# Patient Record
Sex: Male | Born: 1980 | Race: White | Hispanic: No | Marital: Married | Smoking: Never smoker
Health system: Southern US, Community
[De-identification: ages and names within clinical notes are randomized; demographics above are authoritative.]

## PROBLEM LIST (undated history)

## (undated) DIAGNOSIS — F909 Attention-deficit hyperactivity disorder, unspecified type: Secondary | ICD-10-CM

## (undated) DIAGNOSIS — F419 Anxiety disorder, unspecified: Secondary | ICD-10-CM

## (undated) DIAGNOSIS — F32A Depression, unspecified: Secondary | ICD-10-CM

## (undated) DIAGNOSIS — F329 Major depressive disorder, single episode, unspecified: Secondary | ICD-10-CM

## (undated) HISTORY — DX: Anxiety disorder, unspecified: F41.9

---

## 2013-06-01 DIAGNOSIS — F988 Other specified behavioral and emotional disorders with onset usually occurring in childhood and adolescence: Secondary | ICD-10-CM | POA: Insufficient documentation

## 2013-06-03 ENCOUNTER — Encounter (HOSPITAL_BASED_OUTPATIENT_CLINIC_OR_DEPARTMENT_OTHER): Payer: Self-pay | Admitting: Emergency Medicine

## 2013-06-03 ENCOUNTER — Emergency Department (HOSPITAL_BASED_OUTPATIENT_CLINIC_OR_DEPARTMENT_OTHER)
Admission: EM | Admit: 2013-06-03 | Discharge: 2013-06-03 | Disposition: A | Discharge status: Home / Self Care | Payer: Self-pay | Attending: Emergency Medicine | Admitting: Emergency Medicine

## 2013-06-03 DIAGNOSIS — Y92410 Unspecified street and highway as the place of occurrence of the external cause: Secondary | ICD-10-CM | POA: Insufficient documentation

## 2013-06-03 DIAGNOSIS — S060X0A Concussion without loss of consciousness, initial encounter: Secondary | ICD-10-CM | POA: Insufficient documentation

## 2013-06-03 DIAGNOSIS — F3289 Other specified depressive episodes: Secondary | ICD-10-CM | POA: Insufficient documentation

## 2013-06-03 DIAGNOSIS — F329 Major depressive disorder, single episode, unspecified: Secondary | ICD-10-CM | POA: Insufficient documentation

## 2013-06-03 DIAGNOSIS — Z79899 Other long term (current) drug therapy: Secondary | ICD-10-CM | POA: Insufficient documentation

## 2013-06-03 DIAGNOSIS — S0990XA Unspecified injury of head, initial encounter: Secondary | ICD-10-CM

## 2013-06-03 DIAGNOSIS — Y9389 Activity, other specified: Secondary | ICD-10-CM | POA: Insufficient documentation

## 2013-06-03 DIAGNOSIS — R110 Nausea: Secondary | ICD-10-CM | POA: Insufficient documentation

## 2013-06-03 HISTORY — DX: Major depressive disorder, single episode, unspecified: F32.9

## 2013-06-03 HISTORY — DX: Depression, unspecified: F32.A
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
## 2013-06-03 NOTE — ED Notes (Signed)
D/c home. Ambulatory with steady gait

## 2016-01-17 ENCOUNTER — Ambulatory Visit (INDEPENDENT_AMBULATORY_CARE_PROVIDER_SITE_OTHER): Payer: Worker's Compensation | Admitting: Family Medicine

## 2016-01-17 VITALS — BP 118/70 | HR 75 | Temp 98.0°F

## 2016-01-17 DIAGNOSIS — S0191XA Laceration without foreign body of unspecified part of head, initial encounter: Secondary | ICD-10-CM | POA: Diagnosis not present

## 2016-01-17 DIAGNOSIS — Z23 Encounter for immunization: Secondary | ICD-10-CM

## 2016-01-17 MED ORDER — CEPHALEXIN 500 MG PO CAPS: 500.0000 mg | ORAL_CAPSULE | Freq: Two times a day (BID) | ORAL | 0 refills | Status: AC

## 2016-01-17 NOTE — Patient Instructions (Addendum)
HEAD INJURY If any of the following occur notify your physician or go to the Hospital Emergency Department: . Increased drowsiness, stupor or loss of consciousness . Restlessness or convulsions (fits) . Paralysis in arms or legs . Temperature above 100 F . Vomiting . Severe headache . Blood or clear fluid dripping from the nose or ears . Stiffness of the neck . Dizziness or blurred vision . Pulsating pain in the eye . Unequal pupils of eye . Personality changes . Any other unusual symptoms PRECAUTIONS . Keep head elevated at all times for the first 24 hours (Elevate mattress if pillow is ineffective) . Do not take tranquilizers, sedatives, narcotics or alcohol . Avoid aspirin. Use only acetaminophen (e.g. Tylenol) or ibuprofen (e.g. Advil) for relief of pain. Follow directions on the bottle for dosage. . Use ice packs for comfort Have parent, spouse, or friend awaken patient every 4 hour(s) and assess. MEDICATIONS Use medications only as directed by your physician  WOUND CARE Please return in 7 days to have your stitches/staples removed or sooner if you have concerns. Marland Kitchen Keep area clean and dry for 24 hours. Do not remove bandage, if applied. . After 24 hours, remove bandage and wash wound gently with mild soap and warm water. Reapply a new bandage after cleaning wound, if directed. . Continue daily cleansing with soap and water until stitches/staples are removed. . Do not apply any ointments or creams to the wound while stitches/staples are in place, as this may cause delayed healing. . Notify the office if you experience any of the following signs of infection: Swelling, redness, pus drainage, streaking, fever >101.0 F . Notify the office if you experience excessive bleeding that does not stop after 15-20 minutes of constant, firm pressure.    IF you received an x-ray today, you will receive an invoice from [facility]. Please contact [facility]  at [phone] with questions or concerns regarding your invoice.   IF you received labwork today, you will receive an invoice from [employer]. Please contact [facility] at [phone] with questions or concerns regarding your invoice.   Our billing staff will not be able to assist you with questions regarding bills from these companies.  You will be contacted with the lab results as soon as they are available. The fastest way to get your results is to activate your My Chart account. Instructions are located on the last page of this paperwork. If you have not heard from [place] regarding the results in 2 weeks, please contact this office.

## 2016-01-17 NOTE — Progress Notes (Signed)
Greg Jackson [birth year] 35 y.o.   Chief Complaint  Patient presents with  . Head Injury    hit head at work around 20 mins ago.    Date of Injury: 01/17/16  History of Present Illness:  Presents for evaluation of work-related complaint. [employer]. 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   Once the teeth slipped a bar feel down hit him the left side head.  He did not lose consciousness.  He was in a squatting position when impact occurred. After injury, patient noticed a large amount of blood draining from his left side of head/scalp. Applied pressure and cleaned area. Supervisor was present during incident. Patient hasn't taken any medication. He was transported to [facility] via office today by a co-worker. Denies headache but describes mild pressure at the site impact. Denies blurry vision.Denies dizziness. Reports no bloody or clear discharge or drainage from ear or nose.  Review of Systems  Constitutional: Negative for fever and malaise/fatigue.  HENT: Negative for ear discharge, ear pain, nosebleeds and tinnitus.   Respiratory: Negative.   Cardiovascular: Negative.   Neurological: Negative for dizziness, tingling, tremors, speech change, focal weakness, seizures, weakness and headaches.       See HPI   Current medications and allergies reviewed and updated. Past medical history, family history, social history have been reviewed and updated.   Physical Exam  Constitutional: He is oriented to person, place, and time and well-developed, well-nourished, and in no distress.  Cardiovascular: Normal rate, regular rhythm and normal heart sounds.   Pulmonary/Chest: Effort normal and breath sounds normal.  Neurological: He is alert and oriented to person, place, and time. Gait normal. GCS score is 15.     Verbal informed consent obtained Local anesthesia used 1% lidocaine without epinephrine  6.5 cc used  to achieve  effect. without epinephrine used  Cleaned with soap and water Irrigated with normal saline. Prepped with betadine  No glia or body deformity noted No foreign object or  debris in wound bed Closed with #3 simple interrupted 4.0 prolene sutures, cleansed and dressed.  Assessment and Plan:  1. Laceration of head, initial encounter Patient presents to the office with an open laceration to right upper scalp.  He was completely alert, oriented, and exhibited no signs of neurological deficits. Tolerated laceration repair.   Cephalexin 500 mg, twice daily for 5 days to prevent infection. Return in 7 days for suture removal. Handout given regarding red flags following head-injury Wound care instructions provided. Follow-up as needed.  2. Encounter for immunization TDAP given prophylactically to prevent infection due to injury involving an open wound.   Godfrey Pick. Tiburcio Pea, MSN, FNP-C [facility]

## 2016-01-24 ENCOUNTER — Ambulatory Visit (INDEPENDENT_AMBULATORY_CARE_PROVIDER_SITE_OTHER): Payer: Worker's Compensation | Admitting: Physician Assistant

## 2016-01-24 VITALS — BP 110/66 | HR 73 | Temp 98.3°F | Resp 16 | Ht 67.0 in | Wt 190.2 lb

## 2016-01-24 DIAGNOSIS — S0191XD Laceration without foreign body of unspecified part of head, subsequent encounter: Secondary | ICD-10-CM

## 2016-01-24 NOTE — Progress Notes (Signed)
   Greg Jackson  MRN: [id number] DOB: [birth year]  Subjective:  Pt presents to clinic for suture removal.  He has had no problems with the area.  Review of Systems  Constitutional: Negative for chills and fever.    There are no active problems to display for this patient.   Current Outpatient Prescriptions on File Prior to Visit  Medication Sig Dispense Refill  . cephALEXin (KEFLEX) 500 MG capsule Take 1 capsule (500 mg total) by mouth 2 (two) times daily. 14 capsule 0  . escitalopram (LEXAPRO) 20 MG tablet Take 20 mg by mouth daily.     No current facility-administered medications on file prior to visit.     No Known Allergies  Pt patients past, family and social history were reviewed and updated.  Objective:  BP 110/66 (BP Location: Left Arm, Patient Position: Sitting, Cuff Size: Normal)   Pulse 73   Temp 98.3 F (36.8 C) (Oral)   Resp 16   Ht 5\' 7"  (1.702 m)   Wt 190 lb 3.2 oz (86.3 kg)   SpO2 96%   BMI 29.79 kg/m   Physical Exam  Constitutional: He is oriented to person, place, and time and well-developed, well-nourished, and in no distress.  HENT:  Head: Normocephalic and atraumatic.  Right Ear: External ear normal.  Left Ear: External ear normal.  Eyes: Conjunctivae are normal.  Neck: Normal range of motion.  Pulmonary/Chest: Effort normal.  Neurological: He is alert and oriented to person, place, and time. Gait normal.  Skin: Skin is warm and dry.  Well healed wound on the left upper forehead - scab in place - sutures removed with out problems  Psychiatric: Mood, memory, affect and judgment normal.    Assessment and Plan :  Laceration of head, subsequent encounter wound care d/w pt patient.  Benny Lennert PA-C  [facility] 01/24/2016 3:09 PM

## 2016-01-24 NOTE — Patient Instructions (Signed)
     IF you received an x-ray today, you will receive an invoice from [facility]. Please contact [facility] at [phone] with questions or concerns regarding your invoice.   IF you received labwork today, you will receive an invoice from [facility]. Please contact [facility] at [phone] with questions or concerns regarding your invoice.   Our billing staff will not be able to assist you with questions regarding bills from these companies.  You will be contacted with the lab results as soon as they are available. The fastest way to get your results is to activate your My Chart account. Instructions are located on the last page of this paperwork. If you have not heard from us regarding the results in 2 weeks, please contact this office.      

## 2017-05-27 DIAGNOSIS — F3342 Major depressive disorder, recurrent, in full remission: Secondary | ICD-10-CM | POA: Insufficient documentation

## 2018-01-08 ENCOUNTER — Encounter (HOSPITAL_BASED_OUTPATIENT_CLINIC_OR_DEPARTMENT_OTHER): Payer: Self-pay | Admitting: Emergency Medicine

## 2018-01-08 ENCOUNTER — Other Ambulatory Visit: Payer: Self-pay

## 2018-01-08 ENCOUNTER — Emergency Department (HOSPITAL_BASED_OUTPATIENT_CLINIC_OR_DEPARTMENT_OTHER)
Admission: EM | Admit: 2018-01-08 | Discharge: 2018-01-08 | Disposition: A | Discharge status: Home / Self Care | Payer: Self-pay | Attending: Emergency Medicine | Admitting: Emergency Medicine

## 2018-01-08 DIAGNOSIS — Z79899 Other long term (current) drug therapy: Secondary | ICD-10-CM | POA: Insufficient documentation

## 2018-01-08 DIAGNOSIS — K047 Periapical abscess without sinus: Secondary | ICD-10-CM | POA: Insufficient documentation

## 2018-01-08 HISTORY — DX: Attention-deficit hyperactivity disorder, unspecified type: F90.9

## 2018-01-08 MED ORDER — PENICILLIN V POTASSIUM 250 MG PO TABS: 250.0000 mg | ORAL_TABLET | Freq: Four times a day (QID) | ORAL | 0 refills | Status: AC

## 2018-01-08 NOTE — ED Notes (Signed)
ED Provider at bedside. 

## 2018-01-08 NOTE — ED Provider Notes (Signed)
[facility] EMERGENCY DEPARTMENT Provider Note   CSN: [id number] Arrival date & time: 01/08/18  1046     History   Chief Complaint Chief Complaint  Patient presents with  . Dental Pain    HPI Greg Jackson is a 37 y.o. male.  Patient is a 37 year old male with a history of anxiety and depression who is presenting today with left upper dental pain and facial swelling.  He states his tooth broke off about 6 months ago but symptoms did not really start until 2 days ago.  He denies any difficulty swallowing or fever.  He tried to call the dentist office today but nowhere is open.  The history is provided by the patient.  Dental Pain   This is a new problem. The current episode started 2 days ago. The problem occurs constantly. The problem has been gradually worsening. The pain is at a severity of 3/10. The pain is mild. He has tried nothing for the symptoms. The treatment provided no relief.    Past Medical History:  Diagnosis Date  . ADHD   . Anxiety   . Depression     There are no active problems to display for this patient.   History reviewed. No pertinent surgical history.      Home Medications    Prior to Admission medications   Medication Sig Start Date End Date Taking? Authorizing Provider  escitalopram (LEXAPRO) 20 MG tablet Take 20 mg by mouth daily.    [provider]  penicillin v potassium (VEETID) 250 MG tablet Take 1 tablet (250 mg total) by mouth 4 (four) times daily for 7 days. 01/08/18 01/15/18  Gwyneth SproutPlunkett, Haidan Nhan, MD    Family History No family history on file.  Social History Social History   Tobacco Use  . Smoking status: Never Smoker  . Smokeless tobacco: Never Used  Substance Use Topics  . Alcohol use: Yes  . Drug use: Never     Allergies   Patient has no known allergies.   Review of Systems Review of Systems  All other systems reviewed and are negative.    Physical Exam Updated Vital Signs BP 120/83    Pulse 90   Temp 98 F (36.7 C) (Oral)   Resp 18   Ht 5\' 7"  (1.702 m)   Wt 81.6 kg (180 lb)   SpO2 100%   BMI 28.19 kg/m   Physical Exam  Constitutional: He is oriented to person, place, and time. He appears well-developed and well-nourished. No distress.  HENT:  Head: Normocephalic and atraumatic.  Mouth/Throat: Oropharynx is clear and moist. Dental caries present.    Eyes: Pupils are equal, round, and reactive to light. EOM are normal.  Cardiovascular: Normal rate.  Pulmonary/Chest: Effort normal.  Lymphadenopathy:    He has no cervical adenopathy.  Neurological: He is alert and oriented to person, place, and time.  Skin: Skin is warm and dry.  Psychiatric: He has a normal mood and affect. His behavior is normal.  Nursing note and vitals reviewed.    ED Treatments / Results  Labs (all labs ordered are listed, but only abnormal results are displayed) Labs Reviewed - No data to display  EKG None  Radiology No results found.  Procedures Procedures (including critical care time)  Medications Ordered in ED Medications - No data to display   Initial Impression / Assessment and Plan / ED Course  I have reviewed the triage vital signs and the nursing notes.  Pertinent labs &  imaging results that were available during my care of the patient were reviewed by me and considered in my medical decision making (see chart for details).     Pt with dental caries and facial swelling.  No signs of ludwig's angina or difficulty swallowing and no systemic symptoms. Will treat with PCN and have pt f/u with dentist.  Final Clinical Impressions(s) / ED Diagnoses   Final diagnoses:  Dental abscess    ED Discharge Orders        Ordered    penicillin v potassium (VEETID) 250 MG tablet  4 times daily     01/08/18 1104       Gwyneth Sprout, MD 01/08/18 1108

## 2018-01-08 NOTE — ED Triage Notes (Signed)
L upper dental pain x 2 days due to a broken tooth.

## 2019-01-26 ENCOUNTER — Encounter (HOSPITAL_COMMUNITY): Payer: Self-pay

## 2019-01-26 ENCOUNTER — Other Ambulatory Visit: Payer: Self-pay

## 2019-01-26 ENCOUNTER — Emergency Department (HOSPITAL_COMMUNITY): Payer: Self-pay

## 2019-01-26 ENCOUNTER — Emergency Department (HOSPITAL_COMMUNITY)
Admission: EM | Admit: 2019-01-26 | Discharge: 2019-01-27 | Disposition: A | Discharge status: Home / Self Care | Payer: Self-pay | Attending: Emergency Medicine | Admitting: Emergency Medicine

## 2019-01-26 DIAGNOSIS — R7989 Other specified abnormal findings of blood chemistry: Secondary | ICD-10-CM | POA: Insufficient documentation

## 2019-01-26 DIAGNOSIS — R072 Precordial pain: Secondary | ICD-10-CM | POA: Insufficient documentation

## 2019-01-26 DIAGNOSIS — F419 Anxiety disorder, unspecified: Secondary | ICD-10-CM | POA: Insufficient documentation

## 2019-01-26 DIAGNOSIS — R739 Hyperglycemia, unspecified: Secondary | ICD-10-CM | POA: Insufficient documentation

## 2019-01-26 DIAGNOSIS — F909 Attention-deficit hyperactivity disorder, unspecified type: Secondary | ICD-10-CM | POA: Insufficient documentation

## 2019-01-26 DIAGNOSIS — K029 Dental caries, unspecified: Secondary | ICD-10-CM | POA: Insufficient documentation

## 2019-01-26 DIAGNOSIS — Z79899 Other long term (current) drug therapy: Secondary | ICD-10-CM | POA: Insufficient documentation

## 2019-01-26 LAB — CBC WITH DIFFERENTIAL/PLATELET
Abs Immature Granulocytes: 0.03 10*3/uL (ref 0.00–0.07)
Basophils Absolute: 0.1 10*3/uL (ref 0.0–0.1)
Basophils Relative: 1 %
Eosinophils Absolute: 0.1 10*3/uL (ref 0.0–0.5)
Eosinophils Relative: 1 %
HCT: 38.7 % — ABNORMAL LOW (ref 39.0–52.0)
Hemoglobin: 14 g/dL (ref 13.0–17.0)
Immature Granulocytes: 0 %
Lymphocytes Relative: 28 %
Lymphs Abs: 2.6 10*3/uL (ref 0.7–4.0)
MCH: 31.1 pg (ref 26.0–34.0)
MCHC: 36.2 g/dL — ABNORMAL HIGH (ref 30.0–36.0)
MCV: 86 fL (ref 80.0–100.0)
Monocytes Absolute: 0.7 10*3/uL (ref 0.1–1.0)
Monocytes Relative: 7 %
Neutro Abs: 6.1 10*3/uL (ref 1.7–7.7)
Neutrophils Relative %: 63 %
Platelets: 211 10*3/uL (ref 150–400)
RBC: 4.5 MIL/uL (ref 4.22–5.81)
RDW: 11.8 % (ref 11.5–15.5)
WBC: 9.6 10*3/uL (ref 4.0–10.5)
nRBC: 0 % (ref 0.0–0.2)

## 2019-01-26 LAB — BASIC METABOLIC PANEL
Anion gap: 12 (ref 5–15)
BUN: 13 mg/dL (ref 6–20)
CO2: 23 mmol/L (ref 22–32)
Calcium: 9.4 mg/dL (ref 8.9–10.3)
Chloride: 101 mmol/L (ref 98–111)
Creatinine, Ser: 1.36 mg/dL — ABNORMAL HIGH (ref 0.61–1.24)
GFR calc Af Amer: >60 mL/min (ref >60)
GFR calc non Af Amer: >60 mL/min (ref >60)
Glucose, Bld: 144 mg/dL — ABNORMAL HIGH (ref 70–99)
Potassium: 3.4 mmol/L — ABNORMAL LOW (ref 3.5–5.1)
Sodium: 136 mmol/L (ref 135–145)

## 2019-01-26 LAB — TROPONIN I (HIGH SENSITIVITY): Troponin I (High Sensitivity): 4 ng/L (ref <18)

## 2019-01-26 NOTE — ED Provider Notes (Signed)
[facility] Provider Note   CSN: [id number] Arrival date & time: 01/26/19  1530  History   Chief Complaint Chief Complaint  Patient presents with  . Panic Attack  . Multiple Complaints   HPI Greg Jackson is a 38 y.o. male with past medical history significant for anxiety, ADHD, depression who presents for evaluation multiple complaints.  Patient states he is been having difficulty with his anxiety.  Recently started Wellbutrin approximately 1 week ago.  Has been having increased panic attacks approximately 1-2 times a week.  Patient states that he has had increased stressors and approximately 13 hours PTA he developed substernal chest pain as well as palpitations and became diaphoretic.  Patient states "I thought I was going to die."  Patient states this did feel similar to his previous episodes of panic attacks.  States he is having difficulty focusing at work due to inattention.  States he did have a tick bite approximately 2 months ago to his right hip that had a bull's-eye at that time however resolved after 1 week.  He denies fever, chills, nausea, vomiting, headache, neck pain, neck stiffness, drooling, dysphasia, trismus, abdominal pain, diarrhea, dysuria, decreased range of motion, numbness or tingling to his extremities.  Denies redness, swelling, warmth to his extremities.  Denies additional aggravating relieving factors.  Denies SI, HI, AVH.  Denies prior hospitalizations for his anxiety or depression.  History obtained from patient and past medical records.  No interpreter is used.     HPI  Past Medical History:  Diagnosis Date  . ADHD   . Anxiety   . Depression     There are no active problems to display for this patient.   History reviewed. No pertinent surgical history.      Home Medications    Prior to Admission medications   Medication Sig Start Date End Date Taking? Authorizing Provider  amphetamine-dextroamphetamine  (ADDERALL XR) 10 MG 24 hr capsule Take 10 mg by mouth daily.    [provider]  escitalopram (LEXAPRO) 20 MG tablet Take 20 mg by mouth daily.    [provider]  hydrOXYzine (ATARAX/VISTARIL) 25 MG tablet Take 1 tablet (25 mg total) by mouth every 6 (six) hours. 01/27/19   Phillippe Orlick A, PA-C    Family History History reviewed. No pertinent family history.  Social History Social History   Tobacco Use  . Smoking status: Never Smoker  . Smokeless tobacco: Never Used  Substance Use Topics  . Alcohol use: Yes  . Drug use: Never     Allergies   Patient has no known allergies.   Review of Systems Review of Systems  Constitutional: Negative.   HENT: Negative.   Respiratory: Negative.   Cardiovascular: Positive for chest pain. Negative for palpitations and leg swelling.  Gastrointestinal: Negative.   Genitourinary: Negative.   Musculoskeletal: Negative.   Skin: Negative.   Neurological: Negative.  Negative for dizziness, facial asymmetry and headaches.  Psychiatric/Behavioral: Positive for decreased concentration. Negative for self-injury, sleep disturbance and suicidal ideas. The patient is nervous/anxious.   All other systems reviewed and are negative.    Physical Exam Updated Vital Signs BP 130/90   Pulse 69   Temp 98.3 F (36.8 C) (Oral)   Resp 16   Ht 5\' 8"  (1.727 m)   Wt 84.4 kg   SpO2 97%   BMI 28.28 kg/m   Physical Exam Vitals signs and nursing note reviewed.  Constitutional:      General:  He is not in acute distress.    Appearance: He is not ill-appearing, toxic-appearing or diaphoretic.  HENT:     Head: Normocephalic and atraumatic.     Jaw: There is normal jaw occlusion.     Comments: No submandibular swelling.  Sublingual area soft.  No Ludwig's angina.    Nose: Nose normal.     Mouth/Throat:     Lips: Pink.     Mouth: Mucous membranes are moist.     Dentition: Dental caries present. No dental tenderness, gingival swelling,  dental abscesses or gum lesions.     Pharynx: Oropharynx is clear. Uvula midline.     Tonsils: No tonsillar exudate or tonsillar abscesses.     Comments: Posterior oropharynx clear. Mucous membranes moist. No obvious facial swelling.  No drooling, dysphasia or trismus.  No evidence of PTA or RPA.  Overall poor dentition with multiple dental caries.  No evidence of periapical abscess.  No facial swelling.  No gingival erythema, redness or tenderness. Eyes:     Extraocular Movements: Extraocular movements intact.  Neck:     Musculoskeletal: Full passive range of motion without pain, normal range of motion and neck supple.     Vascular: No carotid bruit or JVD.     Trachea: Trachea and phonation normal.     Meningeal: Brudzinski's sign and Kernig's sign absent.  Cardiovascular:     Rate and Rhythm: Normal rate.     Pulses: Normal pulses.          Radial pulses are 2+ on the right side and 2+ on the left side.       Posterior tibial pulses are 2+ on the right side and 2+ on the left side.     Heart sounds: Normal heart sounds.     Comments: No murmur, rubs or gallops. Pulmonary:     Effort: Pulmonary effort is normal.     Breath sounds: Normal breath sounds and air entry.     Comments: Clear to auscultation bilateral without wheeze, rhonchi rales.  No accessory muscle usage.  Speaks in full sentences without difficulty. Chest:     Chest wall: No deformity, swelling, tenderness, crepitus or edema.  Abdominal:     Comments: Soft, nontender without rebound or guarding.  Normoactive bowel sounds.  Musculoskeletal:     Right lower leg: No edema.     Left lower leg: No edema.     Comments: Moves all 4 extremities without difficulty.  Feet:     Right foot:     Skin integrity: Skin integrity normal.     Left foot:     Skin integrity: Skin integrity normal.  Skin:    General: Skin is warm.     Capillary Refill: Capillary refill takes less than 2 seconds.     Comments: No edema, erythema,  ecchymosis or warmth.  Brisk capillary refill.  Neurological:     General: No focal deficit present.     Mental Status: He is alert and oriented to person, place, and time.     Comments: Cranial nerves II through XII grossly intact.  No facial droop.  Ambulatory that difficulty.    ED Treatments / Results  Labs (all labs ordered are listed, but only abnormal results are displayed) Labs Reviewed  CBC WITH DIFFERENTIAL/PLATELET - Abnormal; Notable for the following components:      Result Value   HCT 38.7 (*)    MCHC 36.2 (*)    All other components within normal limits  BASIC METABOLIC PANEL - Abnormal; Notable for the following components:   Potassium 3.4 (*)    Glucose, Bld 144 (*)    Creatinine, Ser 1.36 (*)    All other components within normal limits  TROPONIN I (HIGH SENSITIVITY)    EKG EKG Interpretation  Date/Time:  Thursday January 26 2019 15:49:11 EDT Ventricular Rate:  84 PR Interval:  150 QRS Duration: 80 QT Interval:  360 QTC Calculation: 425 R Axis:   -14 Text Interpretation:  Normal sinus rhythm Normal ECG Confirmed by Benjiman CorePickering, Nathan [phone]([id number]) on 01/26/2019 11:25:57 PM   Radiology Dg Chest 2 View  Result Date: 01/26/2019 CLINICAL DATA:  Chest pain EXAM: CHEST - 2 VIEW COMPARISON:  None. FINDINGS: The heart size and mediastinal contours are within normal limits. Both lungs are clear. The visualized skeletal structures are unremarkable. IMPRESSION: No active cardiopulmonary disease. Electronically Signed   By: Jasmine PangKim  Fujinaga M.D.   On: 01/26/2019 23:13    Procedures Procedures (including critical care time)  Medications Ordered in ED Medications - No data to display  Initial Impression / Assessment and Plan / ED Course  I have reviewed the triage vital signs and the nursing notes.  Pertinent labs & imaging results that were available during my care of the patient were reviewed by me and considered in my medical decision making (see chart for details).   38 year old male appears otherwise well presents for evaluation multiple complaints.  He is afebrile, nonseptic, non-ill-appearing.  Patient with substernal chest pain which occurred approximately 12 hours prior to my initial evaluation.  Substernal with palpitations and diaphoresis.  Does have history of panic attacks and states this feels similar.  Denies exertional chest pain or pleuritic chest pain.  PERC, Wells criteria low risk. HEART score 0. No history of hypertension, diabetes, family history of MI early age.  No evidence of DVT on exam. Denies SI, HI, AVH.  No evidence of DVT on exam.  Heart and lungs clear.  Recently started on Wellbutrin for his anxiety from his PCP. No prior history of psych hospitalizations.  EKG sinus rhythm without ST/T changes.  No STEMI. DG chest without infiltrates, cardiomegaly, pulmonary edema, pneumothorax, widened mediastinum. Troponin negative, given symptoms greater than 13 hours PTA do not feel patient needs delta troponin at this time.  I have low suspicion for ACS, PE, dissection, myocarditis, pericarditis. CBC without leukocytosis BMP mild hypokalemia 3.0, creatinine 1.36, hyperglycemia at 144     Patient is to be discharged with recommendation to follow up with PCP in regards to today's hospital visit. Chest pain is not likely of cardiac or pulmonary etiology d/t presentation, PERC negative, VSS, no tracheal deviation, no JVD or new murmur, RRR, breath sounds equal bilaterally, EKG without acute abnormalities, negative troponin, and negative CXR. Pt has been advised to return to the ED if CP becomes exertional, associated with diaphoresis or nausea, radiates to left jaw/arm, worsens or becomes concerning in any way. Pt appears reliable for follow up and is agreeable to discharge.   The patient has been appropriately medically screened and/or stabilized in the ED. I have low suspicion for any other emergent medical condition which would require further  screening, evaluation or treatment in the ED or require inpatient management.  Patient is hemodynamically stable and in no acute distress.  Patient able to ambulate in department prior to ED.  Evaluation does not show acute pathology that would require ongoing or additional emergent interventions while in the emergency department or further inpatient treatment.  I have discussed the diagnosis with the patient and answered all questions.  Pain is been managed while in the emergency department and patient has no further complaints prior to discharge.  Patient is comfortable with plan discussed in room and is stable for discharge at this time.  I have discussed strict return precautions for returning to the emergency department.  Patient was encouraged to follow-up with PCP/specialist refer to at discharge.  Final Clinical Impressions(s) / ED Diagnoses   Final diagnoses:  Anxiety  Precordial pain  Elevated serum creatinine  Blood glucose elevated    ED Discharge Orders         Ordered    hydrOXYzine (ATARAX/VISTARIL) 25 MG tablet  Every 6 hours     01/27/19 0001           Theodoros Stjames A, PA-C 01/27/19 0006    Benjiman CorePickering, Nathan, MD 01/28/19 [phone]
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
## 2019-01-27 LAB — TROPONIN I (HIGH SENSITIVITY): Troponin I (High Sensitivity): 3 ng/L (ref <18)

## 2019-01-27 MED ORDER — HYDROXYZINE HCL 25 MG PO TABS: 25.0000 mg | ORAL_TABLET | Freq: Four times a day (QID) | ORAL | 0 refills | Status: DC

## 2019-01-27 NOTE — Discharge Instructions (Signed)
Follow-up with PCP for reevaluation.  Take medications as prescribed.

## 2019-08-23 ENCOUNTER — Other Ambulatory Visit: Payer: Self-pay

## 2019-08-23 ENCOUNTER — Ambulatory Visit (INDEPENDENT_AMBULATORY_CARE_PROVIDER_SITE_OTHER): Payer: Self-pay | Admitting: Adult Health Nurse Practitioner

## 2019-08-23 ENCOUNTER — Encounter: Payer: Self-pay | Admitting: Adult Health Nurse Practitioner

## 2019-08-23 VITALS — BP 131/82 | HR 74 | Temp 98.1°F | Ht 67.0 in | Wt 177.2 lb

## 2019-08-23 DIAGNOSIS — F988 Other specified behavioral and emotional disorders with onset usually occurring in childhood and adolescence: Secondary | ICD-10-CM

## 2019-08-23 DIAGNOSIS — F3342 Major depressive disorder, recurrent, in full remission: Secondary | ICD-10-CM

## 2019-08-23 MED ORDER — CLONAZEPAM 0.5 MG PO TABS: 0.5000 mg | ORAL_TABLET | Freq: Two times a day (BID) | ORAL | 0 refills | Status: DC | PRN

## 2019-08-23 MED ORDER — AMPHETAMINE-DEXTROAMPHETAMINE 20 MG PO TABS: 20.0000 mg | ORAL_TABLET | Freq: Two times a day (BID) | ORAL | 0 refills | Status: DC

## 2019-08-23 MED ORDER — ESCITALOPRAM OXALATE 20 MG PO TABS: 20.0000 mg | ORAL_TABLET | Freq: Every day | ORAL | 1 refills | Status: DC

## 2019-08-23 NOTE — Patient Instructions (Addendum)

## 2019-08-23 NOTE — Progress Notes (Signed)
  Chief Complaint  Patient presents with  . Establish Care    HPI   Patient presents to establish care.  Had been seen here many years ago.  Previously, diagnosed with ADHD and has been off his medications for awhile.  Also, was taking Lexapro for anxiety and depression.  Reports these medications have worked best for him in controlling anxiety and depression.    Denies any new depression or difficulties.  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   Problem List    Problem List: 2018-12: Recurrent major depression in full remission (HCC) 2014-12: Dyslexia 2014-12: ADD (attention deficit disorder)   Allergies   has No Known Allergies.  Medications    Current Outpatient Medications:  .  amphetamine-dextroamphetamine (ADDERALL) 30 MG tablet, Take by mouth., Disp: , Rfl:  .  escitalopram (LEXAPRO) 20 MG tablet, Take 20 mg by mouth daily., Disp: , Rfl:    Review of Systems    Constitutional: Negative for activity change, appetite change, chills and fever.  HENT: Negative for congestion, nosebleeds, trouble swallowing and voice change.   Respiratory: Negative for cough, shortness of breath and wheezing.   Cardiac:  Negative for chest pain, pressure, syncope  Gastrointestinal: Negative for diarrhea, nausea and vomiting.  Genitourinary: Negative for difficulty urinating, dysuria, flank pain and hematuria.  Musculoskeletal: Negative for back pain, joint swelling and neck pain.  Neurological: Negative for dizziness, speech difficulty, light-headedness and numbness.  Psychological ROS: positive for - anxiety and depression negative for - concentration difficulties or depression  See HPI. All other review of systems negative.     Physical Exam:    height is 5\' 7"  (1.702 m) and weight is 177 lb 3.2 oz (80.4 kg). His temporal temperature is 98.1 F (36.7 C). His blood pressure is 131/82 and his pulse is 74. His oxygen saturation is 96%.   Physical Examination:  General appearance - alert, well appearing, and in no distress and oriented to person, place, and time Mental status - normal mood, behavior, speech, dress, motor activity, and thought processes Eyes - PERRL. Extraocular movements intact.  No nystagmus.  Neck - supple, no significant adenopathy, carotids upstroke normal bilaterally, no bruits, thyroid exam: thyroid is normal in size without nodules or tenderness Chest - clear to auscultation, no wheezes, rales or rhonchi, symmetric air entry  Heart - normal rate, regular rhythm, normal S1, S2, no murmurs, rubs, clicks or gallops Extremities - dependent LE edema without clubbing or cyanosis Skin - normal coloration and turgor, no rashes, no suspicious skin lesions noted  No hyperpigmentation of skin.  No current hematomas noted   Lab /Imaging Review    no lab studies available for review at time of visit.   Assessment & Plan:  Greg Jackson is a 39 y.o. male    1. Attention deficit disorder, unspecified hyperactivity presence   2. Recurrent major depression in full remission (HCC)    Given his anxiety, will try prn script of Clonazepam for anxiety.  Advised to use sparingly.    20, NP

## 2019-09-20 ENCOUNTER — Encounter: Payer: Self-pay | Admitting: Adult Health Nurse Practitioner

## 2019-09-20 ENCOUNTER — Other Ambulatory Visit: Payer: Self-pay

## 2019-09-20 ENCOUNTER — Ambulatory Visit (INDEPENDENT_AMBULATORY_CARE_PROVIDER_SITE_OTHER): Payer: Self-pay | Admitting: Adult Health Nurse Practitioner

## 2019-09-20 VITALS — BP 120/83 | HR 77 | Temp 98.3°F | Ht 67.0 in | Wt 177.2 lb

## 2019-09-20 DIAGNOSIS — F909 Attention-deficit hyperactivity disorder, unspecified type: Secondary | ICD-10-CM

## 2019-09-20 MED ORDER — AMPHETAMINE-DEXTROAMPHET ER 20 MG PO CP24: 20.0000 mg | ORAL_CAPSULE | ORAL | 0 refills | Status: DC

## 2019-09-20 NOTE — Patient Instructions (Signed)
° ° ° °  If you have lab work done today you will be contacted with your lab results within the next 2 weeks.  If you have not heard from us then please contact us. The fastest way to get your results is to register for My Chart. ° ° °IF you received an x-ray today, you will receive an invoice from [facility]. Please contact [facility] at [phone] with questions or concerns regarding your invoice.  ° °IF you received labwork today, you will receive an invoice from [facility]. Please contact [facility] at [phone] with questions or concerns regarding your invoice.  ° °Our billing staff will not be able to assist you with questions regarding bills from these companies. ° °You will be contacted with the lab results as soon as they are available. The fastest way to get your results is to activate your My Chart account. Instructions are located on the last page of this paperwork. If you have not heard from us regarding the results in 2 weeks, please contact this office. °  ° ° ° °

## 2019-10-17 NOTE — Progress Notes (Signed)
Subjective:     Greg Jackson is a 39 y.o. male who presents for follow up of ADHD.  He is having some side effects from the IR Adderall with sweating, nervousness, and inconsistent effect from the IR.   He has been successful with the XR and would like to change to that dosage form.    The following portions of the patient's history were reviewed and updated as appropriate: allergies, current medications, past family history, past medical history, past social history, past surgical history and problem list.    Objective:    BP 120/83   Pulse 77   Temp 98.3 F (36.8 C) (Temporal)   Ht 5\' 7"  (1.702 m)   Wt 177 lb 3.2 oz (80.4 kg)   SpO2 98%   BMI 27.75 kg/m   General:  alert and cooperative  Affect/Behavior:  good insight, normal perception and normal speech pattern and content no negative findings      Assessment:   1. Attention deficit hyperactivity disorder (ADHD), unspecified ADHD type       Plan:   Meds ordered this encounter  Medications  . amphetamine-dextroamphetamine (ADDERALL XR) 20 MG 24 hr capsule    Sig: Take 1 capsule (20 mg total) by mouth every morning.    Dispense:  30 capsule    Refill:  0   No orders of the defined types were placed in this encounter.  Will f/u in one month to determine if the XR is a better fit.  He is inline with this plan.   , NP

## 2019-10-20 ENCOUNTER — Other Ambulatory Visit: Payer: Self-pay | Admitting: Adult Health Nurse Practitioner

## 2019-10-20 ENCOUNTER — Ambulatory Visit (INDEPENDENT_AMBULATORY_CARE_PROVIDER_SITE_OTHER): Payer: Self-pay | Admitting: Adult Health Nurse Practitioner

## 2019-10-20 ENCOUNTER — Other Ambulatory Visit: Payer: Self-pay

## 2019-10-20 VITALS — BP 140/91 | HR 92 | Temp 98.5°F | Ht 67.0 in | Wt 171.0 lb

## 2019-10-20 DIAGNOSIS — F902 Attention-deficit hyperactivity disorder, combined type: Secondary | ICD-10-CM

## 2019-10-20 DIAGNOSIS — R300 Dysuria: Secondary | ICD-10-CM

## 2019-10-20 DIAGNOSIS — F329 Major depressive disorder, single episode, unspecified: Secondary | ICD-10-CM

## 2019-10-20 DIAGNOSIS — F4329 Adjustment disorder with other symptoms: Secondary | ICD-10-CM

## 2019-10-20 LAB — POCT URINALYSIS DIP (MANUAL ENTRY)
Bilirubin, UA: NEGATIVE
Blood, UA: NEGATIVE
Glucose, UA: NEGATIVE mg/dL
Ketones, POC UA: NEGATIVE mg/dL
Leukocytes, UA: NEGATIVE
Nitrite, UA: NEGATIVE
Protein Ur, POC: NEGATIVE mg/dL
Spec Grav, UA: 1.01 (ref 1.010–1.025)
Urobilinogen, UA: 0.2 E.U./dL
pH, UA: 6 (ref 5.0–8.0)

## 2019-10-20 MED ORDER — AMPHETAMINE-DEXTROAMPHETAMINE 10 MG PO TABS: 45.0000 mg | ORAL_TABLET | Freq: Two times a day (BID) | ORAL | 0 refills | Status: DC

## 2019-10-20 MED ORDER — PAROXETINE HCL 30 MG PO TABS: 30.0000 mg | ORAL_TABLET | Freq: Every day | ORAL | 3 refills | Status: DC

## 2019-10-20 NOTE — Patient Instructions (Signed)
° ° ° °  If you have lab work done today you will be contacted with your lab results within the next 2 weeks.  If you have not heard from us then please contact us. The fastest way to get your results is to register for My Chart. ° ° °IF you received an x-ray today, you will receive an invoice from [facility]. Please contact  Radiology at [phone] with questions or concerns regarding your invoice.  ° °IF you received labwork today, you will receive an invoice from [facility]. Please contact [facility] at [phone] with questions or concerns regarding your invoice.  ° °Our billing staff will not be able to assist you with questions regarding bills from these companies. ° °You will be contacted with the lab results as soon as they are available. The fastest way to get your results is to activate your My Chart account. Instructions are located on the last page of this paperwork. If you have not heard from us regarding the results in 2 weeks, please contact this office. °  ° ° ° °

## 2019-10-23 ENCOUNTER — Telehealth: Payer: Self-pay | Admitting: General Practice

## 2019-10-23 ENCOUNTER — Other Ambulatory Visit: Payer: Self-pay | Admitting: Registered Nurse

## 2019-10-23 DIAGNOSIS — F902 Attention-deficit hyperactivity disorder, combined type: Secondary | ICD-10-CM

## 2019-10-23 MED ORDER — AMPHETAMINE-DEXTROAMPHETAMINE 10 MG PO TABS: 10.0000 mg | ORAL_TABLET | Freq: Two times a day (BID) | ORAL | 0 refills | Status: DC

## 2019-10-23 NOTE — Telephone Encounter (Signed)
The following medications exceeds the dosage allowed to refill . Please advise . Please reach out to pharmacy  amphetamine-dextroamphetamine (ADDERALL) 10 MG tablet [[id number]]  [facility] [id number], [place] - [address]  [address] [place] [address]  Phone: [phone] Fax: [phone]

## 2019-10-23 NOTE — Telephone Encounter (Signed)
Patient needs refill on Adderall  And pharmacy stated they could not give him the ADDERALL 10 mg tablets because of the instructions.  Please Advise

## 2019-10-25 ENCOUNTER — Other Ambulatory Visit: Payer: Self-pay | Admitting: Adult Health Nurse Practitioner

## 2019-10-25 MED ORDER — AMPHETAMINE-DEXTROAMPHET ER 20 MG PO CP24
20.0000 mg | ORAL_CAPSULE | ORAL | 0 refills | Status: DC
Start: 2019-10-25 — End: 2020-05-24

## 2019-10-26 ENCOUNTER — Encounter: Payer: Self-pay | Admitting: Adult Health Nurse Practitioner

## 2019-10-26 DIAGNOSIS — F4329 Adjustment disorder with other symptoms: Secondary | ICD-10-CM | POA: Insufficient documentation

## 2019-10-26 HISTORY — DX: Adjustment disorder with other symptoms: F43.29

## 2019-10-26 MED ORDER — PAROXETINE HCL 30 MG PO TABS: 30.0000 mg | ORAL_TABLET | Freq: Every day | ORAL | 3 refills | Status: DC

## 2019-10-26 MED ORDER — CLONAZEPAM 0.5 MG PO TABS: 0.5000 mg | ORAL_TABLET | Freq: Two times a day (BID) | ORAL | 0 refills | Status: DC | PRN

## 2019-10-26 NOTE — Progress Notes (Signed)
Chief Complaint  Patient presents with  . Follow-up    ADHD/Anxiety 39/depression 25    HPI   Greg Jackson is doing an acute follow-up for his increased anxiety, depression, and ADHD symptoms.  Approximately a week ago his wife, back who I also see stated that she wanted a divorce.  The patient admits to having a problem with repeated use of Internet porn and other websites which his wife feels is treating.  He is visibly distressed.  Feels that he cannot control his emotions currently.  Also feels he is failing at his job because he is unable to concentrate due to the pressing emotional situational issues.    He has a long hx of ADHD since childhood in which he struggled and was diagnosed as dyslexic.  He finds it hard to focus both at home and at work and enjoys his work.  He requests to see a therapist as soon as this weekend.  I let him know that the referral would likely be completed this Monday and at that time, could schedule.  If he needs to see someone on the weekend or is feeing mentally unstable, he has contracted to safety and will go to the ER.  We discussed coping mechanisms for his current anxiety and distress state.  Encouraged exercise when he is feeling emotionally labile.  He will consider but has not exercised or biked since his 72s.    He has two children, one 8 and one who is one year.  Reports increased stress surrounding childcare and parental responsibilities as well as trying to prevent imself from turning to porn.   He is quite tearful and distressed during this visit.  We discussed his medication.  He does not think the current antidepressant is effective for him currently.  Anxiety is very high and he is unable to quell racing thoughts.  Though he acknowledged the current situation is causing this, he would like to adjust medications accordingly during this time.  We discussed different SSRIs, SNRIs, as well as IR dosing of the Adderall to help him be able to complete his work  and keep his job.   He denies any suicidal or homicidal ideations.  He and his wife are going to counseling though they haven't gotten an appointment yet.   In addition he complains of dysuria.  He complains of the need to push his bladder in order to urinate like he can start his stream.  He is concerned he might have a urinary tract infection.  There is no pain, burning, or any discharge.  He has been taking anticholinergic such as Benadryl and others over the past week which could be contributing only prior to lab on eight December      Problem List    Problem List: 2021-04: Dysuria 2018-12: Recurrent major depression in full remission (HCC) 2014-12: Dyslexia 2014-12: ADD (attention deficit disorder)   Allergies   has No Known Allergies.  Medications    Current Outpatient Medications:  .  clonazePAM (KLONOPIN) 0.5 MG tablet, Take 1 tablet (0.5 mg total) by mouth 2 (two) times daily as needed for up to 30 doses for anxiety (1 tab q 12 hours prn for anxiety attack)., Disp: 30 tablet, Rfl: 0 .  amphetamine-dextroamphetamine (ADDERALL XR) 20 MG 24 hr capsule, Take 1 capsule (20 mg total) by mouth every morning., Disp: 30 capsule, Rfl: 0 .  amphetamine-dextroamphetamine (ADDERALL) 10 MG tablet, Take 1 tablet (10 mg total) by mouth 2 (two) times daily with  a meal for 60 doses., Disp: 60 tablet, Rfl: 0 .  PARoxetine (PAXIL) 30 MG tablet, Take 1 tablet (30 mg total) by mouth daily., Disp: 30 tablet, Rfl: 3   Review of Systems     General ROS: positive for  - fatigue and sleep disturbance negative for - chills, night sweats or weight loss  Psychological ROS: positive for - anxiety, concentration difficulties, depression, irritability, sleep disturbances and unhealthy behavior negative for - behavioral disorder, mood swings, obsessive thoughts or suicidal ideation    Past medical history:I have reviewed and confirmed the past medical history in the chart. Negative for cardiac,  diabetes, peptic ulcer, renal failure and smoking Medications: reviewed medication list in the chart Allergies: reviewed allergy section in the chart Review of Systems: Negative for chest pain and shortness of breath Constitutional signs: yes    Physical Exam:    height is 5\' 7"  (1.702 m) and weight is 171 lb (77.6 kg). His temporal temperature is 98.5 F (36.9 C). His blood pressure is 140/91 (abnormal) and his pulse is 92. His oxygen saturation is 98%.   General appearance: alert, teary, nervous, and generally appears in mild distress Chest: clear to auscultation, no wheezes, rales or rhonchi, symmetric air entry.  CVS exam: normal rate, regular rhythm, normal S1, S2, no murmurs, rubs, clicks or gallops. Skin exam - normal coloration and turgor, no rashes, no suspicious skin lesions noted. Mental Status: alert, oriented to person, place, and time, affect appropriate to mood, anxious, tangential thinking and jumping from subject to subject.   Lab /Imaging Review    no lab studies available for review at time of visit.   Assessment & Plan:  Greg Jackson is a 39 y.o. male    1. Dysuria   2. Attention deficit hyperactivity disorder (ADHD), combined type   3. Reactive depression    Orders Placed This Encounter  Procedures  . POCT urinalysis dipstick   Meds ordered this encounter  Medications  . PARoxetine (PAXIL) 30 MG tablet    Sig: Take 1 tablet (30 mg total) by mouth daily.    Dispense:  30 tablet    Refill:  3  . DISCONTD: amphetamine-dextroamphetamine (ADDERALL) 10 MG tablet    Sig: Take 4.5 tablets (45 mg total) by mouth 2 (two) times daily for 45 doses.    Dispense:  60 tablet    Refill:  0   F/u in one month. I've explained to him that drugs of the SSRI class can have side effects such as weight gain, sexual dysfunction, insomnia, headache, nausea. These medications are generally effective at alleviating symptoms of anxiety and/or depression. Let me know if  significant side effects do occur.    Glyn Ade, NP

## 2020-05-24 ENCOUNTER — Ambulatory Visit (INDEPENDENT_AMBULATORY_CARE_PROVIDER_SITE_OTHER): Payer: Self-pay | Admitting: Registered Nurse

## 2020-05-24 ENCOUNTER — Other Ambulatory Visit: Payer: Self-pay

## 2020-05-24 ENCOUNTER — Encounter: Payer: Self-pay | Admitting: Registered Nurse

## 2020-05-24 VITALS — BP 132/83 | HR 76 | Temp 98.0°F | Resp 18 | Ht 67.0 in | Wt 190.4 lb

## 2020-05-24 DIAGNOSIS — F4329 Adjustment disorder with other symptoms: Secondary | ICD-10-CM

## 2020-05-24 DIAGNOSIS — Z7689 Persons encountering health services in other specified circumstances: Secondary | ICD-10-CM

## 2020-05-24 DIAGNOSIS — F902 Attention-deficit hyperactivity disorder, combined type: Secondary | ICD-10-CM

## 2020-05-24 MED ORDER — CLONAZEPAM 0.5 MG PO TABS: 0.5000 mg | ORAL_TABLET | Freq: Two times a day (BID) | ORAL | 0 refills | Status: DC | PRN

## 2020-05-24 MED ORDER — AMPHETAMINE-DEXTROAMPHETAMINE 10 MG PO TABS: 5.0000 mg | ORAL_TABLET | Freq: Every day | ORAL | 0 refills | Status: DC

## 2020-05-24 MED ORDER — AMPHETAMINE-DEXTROAMPHET ER 20 MG PO CP24: 20.0000 mg | ORAL_CAPSULE | ORAL | 0 refills | Status: DC

## 2020-05-24 NOTE — Patient Instructions (Signed)
° ° ° °  If you have lab work done today you will be contacted with your lab results within the next 2 weeks.  If you have not heard from us then please contact us. The fastest way to get your results is to register for My Chart. ° ° °IF you received an x-ray today, you will receive an invoice from [facility]. Please contact [facility] at [phone] with questions or concerns regarding your invoice.  ° °IF you received labwork today, you will receive an invoice from [facility]. Please contact [facility] at [phone] with questions or concerns regarding your invoice.  ° °Our billing staff will not be able to assist you with questions regarding bills from these companies. ° °You will be contacted with the lab results as soon as they are available. The fastest way to get your results is to activate your My Chart account. Instructions are located on the last page of this paperwork. If you have not heard from us regarding the results in 2 weeks, please contact this office. °  ° ° ° °

## 2020-05-24 NOTE — Progress Notes (Signed)
Established Patient Office Visit  Subjective:  Patient ID: Greg Jackson, male    DOB: 1980-07-24  Age: 39 y.o. MRN: [id number]  CC:  Chief Complaint  Patient presents with  . Anxiety    Patient states he has been having more issues with his anxiety and trying to get back on his medication.    HPI Jawon Dipiero presents for visit to est care.  Histories reviewed with patient. No updates at this time.  Would like to restart adderall. Hx of adhd, anxiety, and depression. Finds that a lot of his anxiety is from adhd - feels much better when controlled with adderall. Has tolerated this well in the past- best dose was 20mg  ER. He states that 10mg  IR PO bid was not good for him - became irritable.  Unfortunately did not have success with paxil. Has also been on lexapro per chart. Does not think he needs an SSRI at this time.  Has used clonazepam 0.5mg  PO bid PRN. Uses this more when he does not have his adderall. Aims to use this less once he restarts. No concerns here.  PDMP consulted.   Past Medical History:  Diagnosis Date  . ADHD   . Anxiety   . Depression   . Stress and adjustment reaction 10/26/2019    No past surgical history on file.  No family history on file.  Social History   Socioeconomic History  . Marital status: Married    Spouse name: Not on file  . Number of children: Not on file  . Years of education: Not on file  . Highest education level: Not on file  Occupational History  . Not on file  Tobacco Use  . Smoking status: Never Smoker  . Smokeless tobacco: Never Used  Substance and Sexual Activity  . Alcohol use: Yes  . Drug use: Never  . Sexual activity: Not on file  Other Topics Concern  . Not on file  Social History Narrative  . Not on file   Social Determinants of Health   Financial Resource Strain:   . Difficulty of Paying Living Expenses: Not on file  Food Insecurity:   . Worried About in the Last Year: Not on file   . Ran Out of Food in the Last Year: Not on file  Transportation Needs:   . Lack of Transportation (Medical): Not on file  . Lack of Transportation (Non-Medical): Not on file  Physical Activity:   . Days of Exercise per Week: Not on file  . Minutes of Exercise per Session: Not on file  Stress:   . Feeling of Stress : Not on file  Social Connections:   . Frequency of Communication with Friends and Family: Not on file  . Frequency of Social Gatherings with Friends and Family: Not on file  . Attends Religious Services: Not on file  . Active Member of Clubs or Organizations: Not on file  . Attends 12/26/2019 Meetings: Not on file  . Marital Status: Not on file  Intimate Partner Violence:   . Fear of Current or Ex-Partner: Not on file  . Emotionally Abused: Not on file  . Physically Abused: Not on file  . Sexually Abused: Not on file    Outpatient Medications Prior to Visit  Medication Sig Dispense Refill  . amphetamine-dextroamphetamine (ADDERALL XR) 20 MG 24 hr capsule Take 1 capsule (20 mg total) by mouth every morning. 30 capsule 0  . clonazePAM (KLONOPIN) 0.5 MG tablet  Take 1 tablet (0.5 mg total) by mouth 2 (two) times daily as needed for up to 30 doses for anxiety (1 tab q 12 hours prn for anxiety attack). 30 tablet 0  . amphetamine-dextroamphetamine (ADDERALL) 10 MG tablet Take 1 tablet (10 mg total) by mouth 2 (two) times daily with a meal for 60 doses. 60 tablet 0  . PARoxetine (PAXIL) 30 MG tablet Take 1 tablet (30 mg total) by mouth daily. 30 tablet 3   No facility-administered medications prior to visit.    No Known Allergies  ROS Review of Systems  Constitutional: Negative.   HENT: Negative.   Eyes: Negative.   Respiratory: Negative.   Cardiovascular: Negative.   Gastrointestinal: Negative.   Genitourinary: Negative.   Musculoskeletal: Negative.   Skin: Negative.   Neurological: Negative.   Psychiatric/Behavioral: Positive for decreased  concentration. The patient is nervous/anxious.       Objective:    Physical Exam Constitutional:      General: He is not in acute distress.    Appearance: Normal appearance. He is normal weight. He is not ill-appearing, toxic-appearing or diaphoretic.  Cardiovascular:     Rate and Rhythm: Normal rate and regular rhythm.     Heart sounds: Normal heart sounds. No murmur heard.  No friction rub. No gallop.   Pulmonary:     Effort: Pulmonary effort is normal. No respiratory distress.     Breath sounds: Normal breath sounds. No stridor. No wheezing, rhonchi or rales.  Chest:     Chest wall: No tenderness.  Neurological:     General: No focal deficit present.     Mental Status: He is alert and oriented to person, place, and time. Mental status is at baseline.  Psychiatric:        Mood and Affect: Mood normal.        Behavior: Behavior normal.        Thought Content: Thought content normal.        Judgment: Judgment normal.     BP 132/83   Pulse 76   Temp 98 F (36.7 C) (Temporal)   Resp 18   Ht 5\' 7"  (1.702 m)   Wt 190 lb 6.4 oz (86.4 kg)   SpO2 95%   BMI 29.82 kg/m  Wt Readings from Last 3 Encounters:  05/24/20 190 lb 6.4 oz (86.4 kg)  10/20/19 171 lb (77.6 kg)  09/20/19 177 lb 3.2 oz (80.4 kg)     There are no preventive care reminders to display for this patient.  There are no preventive care reminders to display for this patient.  No results found for: TSH Lab Results  Component Value Date   WBC 9.6 01/26/2019   HGB 14.0 01/26/2019   HCT 38.7 (L) 01/26/2019   MCV 86.0 01/26/2019   PLT 211 01/26/2019   Lab Results  Component Value Date   NA 136 01/26/2019   K 3.4 (L) 01/26/2019   CO2 23 01/26/2019   GLUCOSE 144 (H) 01/26/2019   BUN 13 01/26/2019   CREATININE 1.36 (H) 01/26/2019   CALCIUM 9.4 01/26/2019   ANIONGAP 12 01/26/2019   No results found for: CHOL No results found for: HDL No results found for: LDLCALC No results found for: TRIG No  results found for: CHOLHDL No results found for: 03/28/2019    Assessment & Plan:   Problem List Items Addressed This Visit      Other   ADD (attention deficit disorder) (Chronic)   Relevant Medications  amphetamine-dextroamphetamine (ADDERALL XR) 20 MG 24 hr capsule   amphetamine-dextroamphetamine (ADDERALL) 10 MG tablet   Stress and adjustment reaction - Primary   Relevant Medications   clonazePAM (KLONOPIN) 0.5 MG tablet      No orders of the defined types were placed in this encounter.   Follow-up: No follow-ups on file.   PLAN  Restart meds as ordered above  Return with any concerns  Return when est with insurance for CPE and labs  Return in 6 mo for med check  Patient encouraged to call clinic with any questions, comments, or concerns.  Janeece Agee, NP

## 2020-06-27 ENCOUNTER — Other Ambulatory Visit: Payer: Self-pay | Admitting: Registered Nurse

## 2020-06-27 DIAGNOSIS — F4329 Adjustment disorder with other symptoms: Secondary | ICD-10-CM

## 2020-06-28 NOTE — Telephone Encounter (Signed)
Patient is requesting a refill of the following medications: Requested Prescriptions   Pending Prescriptions Disp Refills   clonazePAM (KLONOPIN) 0.5 MG tablet 60 tablet 0    Sig: Take 1 tablet (0.5 mg total) by mouth 2 (two) times daily as needed for anxiety (1 tab q 12 hours prn for anxiety attack).    Date of patient request: 06/27/20 Last office visit: 05/24/20 Date of last refill: 05/24/20 Last refill amount: 60 Follow up time period per chart:

## 2020-06-29 MED ORDER — CLONAZEPAM 0.5 MG PO TABS: 0.5000 mg | ORAL_TABLET | Freq: Two times a day (BID) | ORAL | 0 refills | Status: DC | PRN

## 2020-08-16 ENCOUNTER — Other Ambulatory Visit: Payer: Self-pay | Admitting: Registered Nurse

## 2020-08-16 DIAGNOSIS — F902 Attention-deficit hyperactivity disorder, combined type: Secondary | ICD-10-CM

## 2020-08-19 NOTE — Telephone Encounter (Signed)
Patient is requesting a refill of the following medications: Requested Prescriptions   Pending Prescriptions Disp Refills  . amphetamine-dextroamphetamine (ADDERALL XR) 20 MG 24 hr capsule 90 capsule 0    Sig: Take 1 capsule (20 mg total) by mouth every morning.    Date of patient request: 08/16/2020 Last office visit:05/24/2020 Date of last refill: 05/24/2020 Last refill amount: 90 capsules  Follow up time period per chart: 08/26/2020

## 2020-08-20 MED ORDER — AMPHETAMINE-DEXTROAMPHET ER 20 MG PO CP24: 20.0000 mg | ORAL_CAPSULE | ORAL | 0 refills | Status: DC

## 2020-08-26 ENCOUNTER — Ambulatory Visit: Payer: Self-pay | Admitting: Registered Nurse

## 2020-08-30 ENCOUNTER — Ambulatory Visit: Payer: Self-pay | Admitting: Registered Nurse

## 2021-03-30 ENCOUNTER — Emergency Department (HOSPITAL_COMMUNITY): Payer: Self-pay

## 2021-03-30 ENCOUNTER — Inpatient Hospital Stay (HOSPITAL_COMMUNITY)
Admission: EM | Admit: 2021-03-30 | Discharge: 2021-03-31 | DRG: 493 | Disposition: A | Discharge status: Home / Self Care | Payer: Self-pay | Attending: Student | Admitting: Student

## 2021-03-30 ENCOUNTER — Encounter (HOSPITAL_COMMUNITY): Payer: Self-pay | Admitting: Emergency Medicine

## 2021-03-30 ENCOUNTER — Inpatient Hospital Stay (HOSPITAL_COMMUNITY): Payer: Self-pay

## 2021-03-30 ENCOUNTER — Other Ambulatory Visit: Payer: Self-pay

## 2021-03-30 DIAGNOSIS — R480 Dyslexia and alexia: Secondary | ICD-10-CM | POA: Diagnosis present

## 2021-03-30 DIAGNOSIS — S82202A Unspecified fracture of shaft of left tibia, initial encounter for closed fracture: Secondary | ICD-10-CM | POA: Diagnosis present

## 2021-03-30 DIAGNOSIS — Z79899 Other long term (current) drug therapy: Secondary | ICD-10-CM

## 2021-03-30 DIAGNOSIS — S82832A Other fracture of upper and lower end of left fibula, initial encounter for closed fracture: Secondary | ICD-10-CM

## 2021-03-30 DIAGNOSIS — F4320 Adjustment disorder, unspecified: Secondary | ICD-10-CM | POA: Diagnosis present

## 2021-03-30 DIAGNOSIS — Z419 Encounter for procedure for purposes other than remedying health state, unspecified: Secondary | ICD-10-CM

## 2021-03-30 DIAGNOSIS — T148XXA Other injury of unspecified body region, initial encounter: Secondary | ICD-10-CM

## 2021-03-30 DIAGNOSIS — F3342 Major depressive disorder, recurrent, in full remission: Secondary | ICD-10-CM | POA: Diagnosis present

## 2021-03-30 DIAGNOSIS — Z20822 Contact with and (suspected) exposure to covid-19: Secondary | ICD-10-CM | POA: Diagnosis present

## 2021-03-30 DIAGNOSIS — F909 Attention-deficit hyperactivity disorder, unspecified type: Secondary | ICD-10-CM | POA: Diagnosis present

## 2021-03-30 DIAGNOSIS — S82872A Displaced pilon fracture of left tibia, initial encounter for closed fracture: Principal | ICD-10-CM | POA: Diagnosis present

## 2021-03-30 DIAGNOSIS — Y9355 Activity, bike riding: Secondary | ICD-10-CM

## 2021-03-30 DIAGNOSIS — S82302A Unspecified fracture of lower end of left tibia, initial encounter for closed fracture: Secondary | ICD-10-CM

## 2021-03-30 DIAGNOSIS — F419 Anxiety disorder, unspecified: Secondary | ICD-10-CM | POA: Diagnosis present

## 2021-03-30 LAB — CBC WITH DIFFERENTIAL/PLATELET
Abs Immature Granulocytes: 0.09 10*3/uL — ABNORMAL HIGH (ref 0.00–0.07)
Basophils Absolute: 0.1 10*3/uL (ref 0.0–0.1)
Basophils Relative: 0 %
Eosinophils Absolute: 0 10*3/uL (ref 0.0–0.5)
Eosinophils Relative: 0 %
HCT: 43.4 % (ref 39.0–52.0)
Hemoglobin: 15.4 g/dL (ref 13.0–17.0)
Immature Granulocytes: 0 %
Lymphocytes Relative: 6 %
Lymphs Abs: 1.3 10*3/uL (ref 0.7–4.0)
MCH: 31.7 pg (ref 26.0–34.0)
MCHC: 35.5 g/dL (ref 30.0–36.0)
MCV: 89.3 fL (ref 80.0–100.0)
Monocytes Absolute: 0.7 10*3/uL (ref 0.1–1.0)
Monocytes Relative: 3 %
Neutro Abs: 18.4 10*3/uL — ABNORMAL HIGH (ref 1.7–7.7)
Neutrophils Relative %: 91 %
Platelets: 262 10*3/uL (ref 150–400)
RBC: 4.86 MIL/uL (ref 4.22–5.81)
RDW: 12.1 % (ref 11.5–15.5)
WBC: 20.5 10*3/uL — ABNORMAL HIGH (ref 4.0–10.5)
nRBC: 0 % (ref 0.0–0.2)

## 2021-03-30 LAB — BASIC METABOLIC PANEL
Anion gap: 9 (ref 5–15)
BUN: 15 mg/dL (ref 6–20)
CO2: 24 mmol/L (ref 22–32)
Calcium: 9.4 mg/dL (ref 8.9–10.3)
Chloride: 102 mmol/L (ref 98–111)
Creatinine, Ser: 1.26 mg/dL — ABNORMAL HIGH (ref 0.61–1.24)
GFR, Estimated: >60 mL/min (ref >60)
Glucose, Bld: 125 mg/dL — ABNORMAL HIGH (ref 70–99)
Potassium: 3.9 mmol/L (ref 3.5–5.1)
Sodium: 135 mmol/L (ref 135–145)

## 2021-03-30 LAB — TYPE AND SCREEN
ABO/RH(D): O NEG
Antibody Screen: NEGATIVE

## 2021-03-30 LAB — HIV ANTIBODY (ROUTINE TESTING W REFLEX): HIV Screen 4th Generation wRfx: NONREACTIVE

## 2021-03-30 MED ORDER — ACETAMINOPHEN 500 MG PO TABS
500.0000 mg | ORAL_TABLET | Freq: Four times a day (QID) | ORAL | Status: DC
  Filled 2021-03-30: qty 1

## 2021-03-30 MED ORDER — MORPHINE SULFATE (PF) 4 MG/ML IV SOLN
4.0000 mg | Freq: Once | INTRAVENOUS | Status: AC
  Administered 2021-03-30: 4 mg via INTRAVENOUS
  Filled 2021-03-30: qty 1

## 2021-03-30 MED ORDER — MORPHINE SULFATE (PF) 4 MG/ML IV SOLN
4.0000 mg | Freq: Once | INTRAVENOUS | Status: DC
Start: 2021-03-30 — End: 2021-03-31
  Filled 2021-03-30: qty 1

## 2021-03-30 MED ORDER — HYDROCODONE-ACETAMINOPHEN 5-325 MG PO TABS: 1.0000 | ORAL_TABLET | ORAL | Status: DC | PRN

## 2021-03-30 MED ORDER — ONDANSETRON HCL 4 MG PO TABS: 4.0000 mg | ORAL_TABLET | Freq: Four times a day (QID) | ORAL | Status: DC | PRN

## 2021-03-30 MED ORDER — AMPHETAMINE-DEXTROAMPHET ER 20 MG PO CP24: 20.0000 mg | ORAL_CAPSULE | ORAL | Status: DC

## 2021-03-30 MED ORDER — MORPHINE SULFATE (PF) 2 MG/ML IV SOLN
1.0000 mg | INTRAVENOUS | Status: DC | PRN
  Administered 2021-03-31 (×3): 2 mg via INTRAVENOUS
  Filled 2021-03-30 (×3): qty 1

## 2021-03-30 MED ORDER — ONDANSETRON HCL 4 MG/2ML IJ SOLN: 4.0000 mg | Freq: Four times a day (QID) | INTRAMUSCULAR | Status: DC | PRN

## 2021-03-30 MED ORDER — HYDROCODONE-ACETAMINOPHEN 7.5-325 MG PO TABS: 1.0000 | ORAL_TABLET | ORAL | Status: DC | PRN

## 2021-03-30 MED ORDER — MORPHINE SULFATE (PF) 2 MG/ML IV SOLN: 0.5000 mg | INTRAVENOUS | Status: DC | PRN
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
## 2021-03-30 NOTE — Progress Notes (Signed)
Orthopedic Tech Progress Note Patient Details:  Greg Jackson [birth year] [id number]  Ortho Devices Type of Ortho Device: Post (short leg) splint, Stirrup splint Ortho Device/Splint Location: lle Ortho Device/Splint Interventions: Ordered, Adjustment, Application   Post Interventions Patient Tolerated: Well Instructions Provided: Care of device, Adjustment of device  [name] 03/30/2021, 10:48 PM
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
## 2021-03-30 NOTE — ED Notes (Signed)
Patient transported to CT 

## 2021-03-30 NOTE — ED Notes (Signed)
Pt's homemade splint with a piece of wood and duct tape and towel removed at triage.  CMS intact.  Swelling present.
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
## 2021-03-31 ENCOUNTER — Encounter (HOSPITAL_COMMUNITY): Payer: Self-pay | Admitting: Orthopedic Surgery

## 2021-03-31 ENCOUNTER — Inpatient Hospital Stay (HOSPITAL_COMMUNITY): Payer: Self-pay | Admitting: Certified Registered"

## 2021-03-31 ENCOUNTER — Inpatient Hospital Stay (HOSPITAL_COMMUNITY): Payer: Self-pay

## 2021-03-31 ENCOUNTER — Encounter (HOSPITAL_COMMUNITY)
Admission: EM | Disposition: A | Discharge status: Home / Self Care | Payer: Self-pay | Source: Home / Self Care | Attending: Orthopedic Surgery

## 2021-03-31 DIAGNOSIS — S82872A Displaced pilon fracture of left tibia, initial encounter for closed fracture: Secondary | ICD-10-CM

## 2021-03-31 HISTORY — PX: ORIF TIBIA FRACTURE: SHX5416

## 2021-03-31 LAB — RESP PANEL BY RT-PCR (FLU A&B, COVID) ARPGX2
Influenza A by PCR: NEGATIVE
Influenza B by PCR: NEGATIVE
SARS Coronavirus 2 by RT PCR: NEGATIVE

## 2021-03-31 LAB — ABO/RH: ABO/RH(D): O NEG

## 2021-03-31 SURGERY — OPEN REDUCTION INTERNAL FIXATION (ORIF) TIBIA FRACTURE
Anesthesia: Regional | Site: Leg Lower | Laterality: Left

## 2021-03-31 MED ORDER — VANCOMYCIN HCL 1000 MG IV SOLR
INTRAVENOUS | Status: AC
  Filled 2021-03-31: qty 20

## 2021-03-31 MED ORDER — ROPIVACAINE HCL 5 MG/ML IJ SOLN
INTRAMUSCULAR | Status: DC | PRN
  Administered 2021-03-31: 15 mL via PERINEURAL
  Administered 2021-03-31: 25 mL via PERINEURAL

## 2021-03-31 MED ORDER — ONDANSETRON HCL 4 MG PO TABS: 4.0000 mg | ORAL_TABLET | Freq: Four times a day (QID) | ORAL | 0 refills | Status: AC | PRN

## 2021-03-31 MED ORDER — DEXMEDETOMIDINE (PRECEDEX) IN NS 20 MCG/5ML (4 MCG/ML) IV SYRINGE
PREFILLED_SYRINGE | INTRAVENOUS | Status: AC
  Filled 2021-03-31: qty 5

## 2021-03-31 MED ORDER — LIDOCAINE 2% (20 MG/ML) 5 ML SYRINGE
INTRAMUSCULAR | Status: DC | PRN
  Administered 2021-03-31: 20 mg via INTRAVENOUS

## 2021-03-31 MED ORDER — MIDAZOLAM HCL 2 MG/2ML IJ SOLN: 2.0000 mg | Freq: Once | INTRAMUSCULAR | Status: AC

## 2021-03-31 MED ORDER — EPHEDRINE SULFATE-NACL 50-0.9 MG/10ML-% IV SOSY
PREFILLED_SYRINGE | INTRAVENOUS | Status: DC | PRN
  Administered 2021-03-31: 10 mg via INTRAVENOUS

## 2021-03-31 MED ORDER — CEFAZOLIN SODIUM-DEXTROSE 2-4 GM/100ML-% IV SOLN
2.0000 g | Freq: Once | INTRAVENOUS | Status: AC
  Administered 2021-03-31: 2 g via INTRAVENOUS

## 2021-03-31 MED ORDER — MIDAZOLAM HCL 2 MG/2ML IJ SOLN
INTRAMUSCULAR | Status: AC
  Administered 2021-03-31: 2 mg via INTRAVENOUS
  Filled 2021-03-31: qty 2

## 2021-03-31 MED ORDER — EPHEDRINE 5 MG/ML INJ
INTRAVENOUS | Status: AC
  Filled 2021-03-31: qty 5

## 2021-03-31 MED ORDER — ONDANSETRON HCL 4 MG/2ML IJ SOLN
INTRAMUSCULAR | Status: AC
  Filled 2021-03-31: qty 2

## 2021-03-31 MED ORDER — CHLORHEXIDINE GLUCONATE 0.12 % MT SOLN: 15.0000 mL | Freq: Once | OROMUCOSAL | Status: AC

## 2021-03-31 MED ORDER — PHENYLEPHRINE 40 MCG/ML (10ML) SYRINGE FOR IV PUSH (FOR BLOOD PRESSURE SUPPORT)
PREFILLED_SYRINGE | INTRAVENOUS | Status: AC
  Filled 2021-03-31: qty 10

## 2021-03-31 MED ORDER — PROPOFOL 10 MG/ML IV BOLUS
INTRAVENOUS | Status: DC | PRN
  Administered 2021-03-31: 200 mg via INTRAVENOUS

## 2021-03-31 MED ORDER — PROPOFOL 10 MG/ML IV BOLUS
INTRAVENOUS | Status: AC
  Filled 2021-03-31: qty 20

## 2021-03-31 MED ORDER — CEFAZOLIN SODIUM-DEXTROSE 2-4 GM/100ML-% IV SOLN
INTRAVENOUS | Status: AC
  Filled 2021-03-31: qty 100

## 2021-03-31 MED ORDER — PHENYLEPHRINE HCL-NACL 20-0.9 MG/250ML-% IV SOLN
INTRAVENOUS | Status: DC | PRN
  Administered 2021-03-31: 50 ug/min via INTRAVENOUS

## 2021-03-31 MED ORDER — CHLORHEXIDINE GLUCONATE 0.12 % MT SOLN
OROMUCOSAL | Status: AC
  Administered 2021-03-31: 15 mL via OROMUCOSAL
  Filled 2021-03-31: qty 15

## 2021-03-31 MED ORDER — ONDANSETRON HCL 4 MG/2ML IJ SOLN
INTRAMUSCULAR | Status: DC | PRN
  Administered 2021-03-31: 4 mg via INTRAVENOUS

## 2021-03-31 MED ORDER — DEXAMETHASONE SODIUM PHOSPHATE 10 MG/ML IJ SOLN
INTRAMUSCULAR | Status: DC | PRN
  Administered 2021-03-31 (×2): 5 mg

## 2021-03-31 MED ORDER — DEXAMETHASONE SODIUM PHOSPHATE 10 MG/ML IJ SOLN
INTRAMUSCULAR | Status: AC
  Filled 2021-03-31: qty 1

## 2021-03-31 MED ORDER — VANCOMYCIN HCL 1000 MG IV SOLR
INTRAVENOUS | Status: DC | PRN
  Administered 2021-03-31: 1000 mg via TOPICAL

## 2021-03-31 MED ORDER — FENTANYL CITRATE (PF) 100 MCG/2ML IJ SOLN: 25.0000 ug | INTRAMUSCULAR | Status: DC | PRN

## 2021-03-31 MED ORDER — 0.9 % SODIUM CHLORIDE (POUR BTL) OPTIME
TOPICAL | Status: DC | PRN
  Administered 2021-03-31: 1000 mL

## 2021-03-31 MED ORDER — ACETAMINOPHEN 500 MG PO TABS
ORAL_TABLET | ORAL | Status: AC
  Administered 2021-03-31: 1000 mg via ORAL
  Filled 2021-03-31: qty 2

## 2021-03-31 MED ORDER — OXYCODONE-ACETAMINOPHEN 5-325 MG PO TABS: 1.0000 | ORAL_TABLET | ORAL | 0 refills | Status: AC | PRN

## 2021-03-31 MED ORDER — ORAL CARE MOUTH RINSE: 15.0000 mL | Freq: Once | OROMUCOSAL | Status: AC

## 2021-03-31 MED ORDER — FENTANYL CITRATE (PF) 100 MCG/2ML IJ SOLN: 100.0000 ug | Freq: Once | INTRAMUSCULAR | Status: AC

## 2021-03-31 MED ORDER — ACETAMINOPHEN 500 MG PO TABS: 1000.0000 mg | ORAL_TABLET | Freq: Once | ORAL | Status: AC

## 2021-03-31 MED ORDER — FENTANYL CITRATE (PF) 250 MCG/5ML IJ SOLN
INTRAMUSCULAR | Status: AC
  Filled 2021-03-31: qty 5

## 2021-03-31 MED ORDER — DEXAMETHASONE SODIUM PHOSPHATE 10 MG/ML IJ SOLN
INTRAMUSCULAR | Status: DC | PRN
  Administered 2021-03-31: 10 mg via INTRAVENOUS

## 2021-03-31 MED ORDER — PHENYLEPHRINE 40 MCG/ML (10ML) SYRINGE FOR IV PUSH (FOR BLOOD PRESSURE SUPPORT)
PREFILLED_SYRINGE | INTRAVENOUS | Status: DC | PRN
  Administered 2021-03-31 (×2): 120 ug via INTRAVENOUS
  Administered 2021-03-31 (×2): 160 ug via INTRAVENOUS

## 2021-03-31 MED ORDER — ASPIRIN EC 325 MG PO TBEC: 325.0000 mg | DELAYED_RELEASE_TABLET | Freq: Two times a day (BID) | ORAL | 0 refills | Status: AC

## 2021-03-31 MED ORDER — DEXMEDETOMIDINE (PRECEDEX) IN NS 20 MCG/5ML (4 MCG/ML) IV SYRINGE
PREFILLED_SYRINGE | INTRAVENOUS | Status: DC | PRN
  Administered 2021-03-31: 12 ug via INTRAVENOUS

## 2021-03-31 MED ORDER — FENTANYL CITRATE (PF) 100 MCG/2ML IJ SOLN
INTRAMUSCULAR | Status: AC
  Administered 2021-03-31: 100 ug via INTRAVENOUS
  Filled 2021-03-31: qty 2

## 2021-03-31 MED ORDER — LACTATED RINGERS IV SOLN: INTRAVENOUS | Status: DC

## 2021-03-31 MED ORDER — LIDOCAINE 2% (20 MG/ML) 5 ML SYRINGE
INTRAMUSCULAR | Status: AC
  Filled 2021-03-31: qty 5

## 2021-03-31 SURGICAL SUPPLY — 74 items
BAG COUNTER SPONGE SURGICOUNT (BAG) IMPLANT
BANDAGE ESMARK 6X9 LF (GAUZE/BANDAGES/DRESSINGS) IMPLANT
BIT DRILL CALIBRATED 4.2 (BIT) IMPLANT
BIT DRILL QC SFS 2.5X170 (BIT) ×1 IMPLANT
BIT DRILL SHORT 4.2 (BIT) IMPLANT
BLADE CLIPPER SURG (BLADE) IMPLANT
BNDG ELASTIC 4X5.8 VLCR STR LF (GAUZE/BANDAGES/DRESSINGS) ×2 IMPLANT
BNDG ELASTIC 6X5.8 VLCR STR LF (GAUZE/BANDAGES/DRESSINGS) ×2 IMPLANT
BNDG ESMARK 6X9 LF (GAUZE/BANDAGES/DRESSINGS)
BNDG GAUZE ELAST 4 BULKY (GAUZE/BANDAGES/DRESSINGS) ×2 IMPLANT
BRUSH SCRUB EZ PLAIN DRY (MISCELLANEOUS) ×4 IMPLANT
CHLORAPREP W/TINT 26 (MISCELLANEOUS) ×2 IMPLANT
CLSR STERI-STRIP ANTIMIC 1/2X4 (GAUZE/BANDAGES/DRESSINGS) ×1 IMPLANT
COVER SURGICAL LIGHT HANDLE (MISCELLANEOUS) ×2 IMPLANT
CUFF TOURN SGL QUICK 34 (TOURNIQUET CUFF) ×1
CUFF TRNQT CYL 34X4.125X (TOURNIQUET CUFF) ×1 IMPLANT
DRAPE C-ARM 42X72 X-RAY (DRAPES) ×2 IMPLANT
DRAPE C-ARMOR (DRAPES) ×2 IMPLANT
DRAPE ORTHO SPLIT 77X108 STRL (DRAPES) ×2
DRAPE SURG ORHT 6 SPLT 77X108 (DRAPES) ×2 IMPLANT
DRAPE U-SHAPE 47X51 STRL (DRAPES) ×2 IMPLANT
DRILL BIT CALIBRATED 4.2 (BIT) ×2
DRILL BIT SHORT 4.2 (BIT) ×2
DRSG ADAPTIC 3X8 NADH LF (GAUZE/BANDAGES/DRESSINGS) ×2 IMPLANT
DRSG PAD ABDOMINAL 8X10 ST (GAUZE/BANDAGES/DRESSINGS) ×8 IMPLANT
ELECT REM PT RETURN 9FT ADLT (ELECTROSURGICAL) ×2
ELECTRODE REM PT RTRN 9FT ADLT (ELECTROSURGICAL) ×1 IMPLANT
GAUZE SPONGE 4X4 12PLY STRL (GAUZE/BANDAGES/DRESSINGS) ×2 IMPLANT
GAUZE SPONGE 4X4 12PLY STRL LF (GAUZE/BANDAGES/DRESSINGS) ×1 IMPLANT
GLOVE SURG ENC MOIS LTX SZ6.5 (GLOVE) ×6 IMPLANT
GLOVE SURG ENC MOIS LTX SZ7.5 (GLOVE) ×8 IMPLANT
GLOVE SURG UNDER POLY LF SZ6.5 (GLOVE) ×2 IMPLANT
GLOVE SURG UNDER POLY LF SZ7.5 (GLOVE) ×2 IMPLANT
GLOVE XGUARD RR 2 7.5 (GLOVE) ×1 IMPLANT
GLOVE XGUARD RR2 7.5 (GLOVE) ×1
GOWN STRL REUS W/ TWL LRG LVL3 (GOWN DISPOSABLE) ×2 IMPLANT
GOWN STRL REUS W/TWL LRG LVL3 (GOWN DISPOSABLE) ×2
GUIDEWIRE 3.2X400 (WIRE) ×1 IMPLANT
KIT BASIN OR (CUSTOM PROCEDURE TRAY) ×2 IMPLANT
KIT TURNOVER KIT B (KITS) ×2 IMPLANT
MANIFOLD NEPTUNE II (INSTRUMENTS) IMPLANT
NAIL TIB TFNA 9X345 (Nail) ×1 IMPLANT
NS IRRIG 1000ML POUR BTL (IV SOLUTION) ×2 IMPLANT
PACK TOTAL JOINT (CUSTOM PROCEDURE TRAY) ×2 IMPLANT
PAD ARMBOARD 7.5X6 YLW CONV (MISCELLANEOUS) ×4 IMPLANT
PAD CAST 4YDX4 CTTN HI CHSV (CAST SUPPLIES) ×1 IMPLANT
PADDING CAST COTTON 4X4 STRL (CAST SUPPLIES) ×1
PADDING CAST COTTON 6X4 STRL (CAST SUPPLIES) ×2 IMPLANT
REAMER ROD DEEP FLUTE 2.5X950 (INSTRUMENTS) ×1 IMPLANT
SCREW CORTEX 3.5 40MM (Screw) ×1 IMPLANT
SCREW CORTEX 3.5 45MM (Screw) ×1 IMPLANT
SCREW LOCK CORT ST 3.5X40 (Screw) IMPLANT
SCREW LOCK HDLS 5X42 (Screw) ×1 IMPLANT
SCREW LOCK HDLS 5X50 (Screw) ×1 IMPLANT
SCREW LOCK IM 5X38X125 (Screw) ×1 IMPLANT
SCREW LOCK IM 5X74 (Screw) ×1 IMPLANT
SPONGE T-LAP 18X18 ~~LOC~~+RFID (SPONGE) IMPLANT
STAPLER VISISTAT 35W (STAPLE) IMPLANT
SUCTION FRAZIER HANDLE 10FR (MISCELLANEOUS) ×1
SUCTION TUBE FRAZIER 10FR DISP (MISCELLANEOUS) ×1 IMPLANT
SUT ETHILON 3 0 PS 1 (SUTURE) IMPLANT
SUT MNCRL AB 3-0 PS2 18 (SUTURE) ×2 IMPLANT
SUT PROLENE 0 CT (SUTURE) IMPLANT
SUT VIC AB 0 CT1 27 (SUTURE) ×1
SUT VIC AB 0 CT1 27XBRD ANBCTR (SUTURE) ×1 IMPLANT
SUT VIC AB 1 CT1 27 (SUTURE) ×1
SUT VIC AB 1 CT1 27XBRD ANBCTR (SUTURE) ×1 IMPLANT
SUT VIC AB 2-0 CT1 27 (SUTURE) ×2
SUT VIC AB 2-0 CT1 TAPERPNT 27 (SUTURE) ×2 IMPLANT
TOWEL GREEN STERILE (TOWEL DISPOSABLE) ×4 IMPLANT
TOWEL GREEN STERILE FF (TOWEL DISPOSABLE) ×2 IMPLANT
TRAY FOLEY MTR SLVR 16FR STAT (SET/KITS/TRAYS/PACK) IMPLANT
UNDERPAD 30X36 HEAVY ABSORB (UNDERPADS AND DIAPERS) ×2 IMPLANT
WATER STERILE IRR 1000ML POUR (IV SOLUTION) ×4 IMPLANT

## 2021-03-31 NOTE — ED Notes (Signed)
Bracelets, earrings, shirt, pants sent with mother to surgery waiting area
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
## 2021-03-31 NOTE — Transfer of Care (Signed)
Immediate Anesthesia Transfer of Care Note  Patient: Greg Jackson  Procedure(s) Performed: OPEN REDUCTION INTERNAL FIXATION (ORIF) TIBIA FRACTURE (Left: Leg Lower)  Patient Location: PACU  Anesthesia Type:GA combined with regional for post-op pain  Level of Consciousness: drowsy and patient cooperative  Airway & Oxygen Therapy: Patient Spontanous Breathing  Post-op Assessment: Report given to RN and Post -op Vital signs reviewed and stable  Post vital signs: Reviewed and stable  Last Vitals:  Vitals Value Taken Time  BP    Temp    Pulse    Resp    SpO2      Last Pain:  Vitals:   03/31/21 1210  TempSrc:   PainSc: 0-No pain      Patients Stated Pain Goal: 3 (03/31/21 1150)  Complications: No notable events documented.

## 2021-03-31 NOTE — Progress Notes (Signed)
Orthopedic Tech Progress Note Patient Details:  Greg Jackson 1980-06-28 [id number]  Ortho Devices Type of Ortho Device: Crutches Ortho Device/Splint Location: lle Ortho Device/Splint Interventions: Ordered, Adjustment   Post Interventions Patient Tolerated: Well Instructions Provided: Care of device, Adjustment of device  Jayro Mcmath A Cherry Wittwer 03/31/2021, 4:51 PM

## 2021-03-31 NOTE — Interval H&P Note (Signed)
History and Physical Interval Note:  03/31/2021 11:29 AM  Greg Jackson  has presented today for surgery, with the diagnosis of Left closed Tibia Fracture.  The various methods of treatment have been discussed with the patient and family. After consideration of risks, benefits and other options for treatment, the patient has consented to  Procedure(s): OPEN REDUCTION INTERNAL FIXATION (ORIF) TIBIA FRACTURE (Left) as a surgical intervention.  The patient's history has been reviewed, patient examined, no change in status, stable for surgery.  I have reviewed the patient's chart and labs.  Questions were answered to the patient's satisfaction.     Caryn Bee P Emillie Chasen
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
## 2021-03-31 NOTE — Anesthesia Procedure Notes (Signed)
Procedure Name: LMA Insertion Date/Time: 03/31/2021 12:24 PM Performed by: Rosiland Oz, CRNA Pre-anesthesia Checklist: Patient identified, Emergency Drugs available and Suction available Patient Re-evaluated:Patient Re-evaluated prior to induction Oxygen Delivery Method: Circle system utilized Preoxygenation: Pre-oxygenation with 100% oxygen Induction Type: IV induction LMA: LMA inserted LMA Size: 4.0 Number of attempts: 1 Placement Confirmation: positive ETCO2 and breath sounds checked- equal and bilateral Tube secured with: Tape Dental Injury: Teeth and Oropharynx as per pre-operative assessment

## 2021-03-31 NOTE — Progress Notes (Signed)
Orthopedic Tech Progress Note Patient Details:  Greg Jackson 1980-09-22 [id number]  Ortho Devices Type of Ortho Device: CAM walker Ortho Device/Splint Location: lle Ortho Device/Splint Interventions: Ordered   Post Interventions Patient Tolerated: Well Instructions Provided: Care of device, Adjustment of device  Greg Jackson 03/31/2021, 1:56 PM
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
## 2021-03-31 NOTE — Discharge Instructions (Addendum)
Truitt Merle, MD Ulyses Southward PA-C [facility] [address] [phone] Jani Files)   [phone] (fax)                                  POST-OPERATIVE INSTRUCTIONS     WEIGHT BEARING STATUS: Non-weightbearing left lower extremity  RANGE OF MOTION/ACTIVITY: Ok to come out of CAM boot at rest for gentle ankle range of motion. You may have unrestricted knee range of motion as tolerated  WOUND CARE You may remove your surgical dressing on post-op day #2. Leave the yellow/white steri-strips in place, these will fall off on their own. Do NOT apply any ointments, solutions or lotions to pin sites or surgical wounds.  These prevent needed drainage and even though solutions like hydrogen peroxide kill bacteria, they also damage cells lining the pin sites that help fight infection.  Applying lotions or ointments can keep the wounds moist and can cause them to breakdown and open up as well. This can increase the risk for infection. When in doubt call the office. If any drainage is noted, use gauze, Kerlix, and an ace wrap. Once the incision is completely dry and without drainage, it may be left open to air out.  Showering may begin 36-48 hours later.  Cleaning gently with soap and water.   IF YOU ARE IN A CAM BOOT (BLACK BOOT) You may remove boot periodically. Perform daily dressing changes as noted below.  Wash the liner of the boot regularly and wear a sock when wearing the boot. It is recommended that you sleep in the boot until told otherwise  EXERCISES DO NOT PUT ANY WEIGHT ON YOUR OPERATIVE LEG Please use crutches or a walker to avoid weight bearing.   DVT/PE prophylaxis: Aspirin 325 mg twice daily x 30 days  DIET: As you were eating previously.  Can use over the counter stool softeners and bowel preparations, such as Miralax, to help with bowel movements.  Narcotics can be constipating.  Be sure to drink plenty of fluids  REGIONAL ANESTHESIA (NERVE  BLOCKS) The anesthesia team may have performed a nerve block for you if safe in the setting of your care.  This is a great tool used to minimize pain.  Typically the block may start wearing off overnight but the long acting medicine may last for 3-4 days.  The nerve block wearing off can be a challenging period but please utilize your as needed pain medications to try and manage this period.    POST-OP MEDICATIONS- Multimodal approach to pain control  In general your pain will be controlled with a combination of substances.  Prescriptions unless otherwise discussed are electronically sent to your pharmacy.  This is a carefully made plan we use to minimize narcotic use.     - Acetaminophen - Non-narcotic pain medicine taken on a scheduled basis   - Oxycodone - This is a strong narcotic, to be used only on an "as needed" basis for pain.  -  Aspirin 325 mg - This medicine is used to minimize the risk of blood clots after surgery.             -          Zofran - take as needed for nausea   FOLLOW-UP If you develop a Fever (>101.5), Redness or Drainage from the surgical incision site, please call our office to arrange for an evaluation. Please  call the office to schedule a follow-up appointment for your incision check if you do not already have one, 7-10 days post-operatively.   VISIT OUR WEBSITE FOR ADDITIONAL INFORMATION: [email or web address]   HELPFUL INFORMATION  If you had a block, it will wear off between 8-24 hrs postop typically.  This is period when your pain may go from nearly zero to the pain you would have had postop without the block.  This is an abrupt transition but nothing dangerous is happening.  You may take an extra dose of narcotic when this happens.  You should wean off your narcotic medicines as soon as you are able.  Most patients will be off or using minimal narcotics before their first postop appointment.   We suggest you use the pain medication the first night prior to  going to bed, in order to ease any pain when the anesthesia wears off. You should avoid taking pain medications on an empty stomach as it will make you nauseous.  Do not drink alcoholic beverages or take illicit drugs when taking pain medications.  In most states it is against the law to drive while you are in a splint or sling.  And certainly against the law to drive while taking narcotics.  You may return to work/school in the next couple of days when you feel up to it.   Pain medication may make you constipated.  Below are a few solutions to try in this order: Decrease the amount of pain medication if you aren't having pain. Drink lots of decaffeinated fluids. Drink prune juice and/or each dried prunes  If the first 3 don't work start with additional solutions Take Colace - an over-the-counter stool softener Take Senokot - an over-the-counter laxative Take Miralax - a stronger over-the-counter laxative
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
## 2021-03-31 NOTE — Anesthesia Procedure Notes (Signed)
Anesthesia Regional Block: Popliteal block   Pre-Anesthetic Checklist: , timeout performed,  Correct Patient, Correct Site, Correct Laterality,  Correct Procedure, Correct Position, site marked,  Risks and benefits discussed,  Surgical consent,  Pre-op evaluation,  At surgeon's request and post-op pain management  Laterality: Left  Prep: Maximum Sterile Barrier Precautions used, chloraprep       Needles:  Injection technique: Single-shot  Needle Type: Echogenic Stimulator Needle     Needle Length: 9cm  Needle Gauge: 22     Additional Needles:   Procedures:,,,, ultrasound used (permanent image in chart),,    Narrative:  Start time: 03/31/2021 11:45 AM End time: 03/31/2021 11:58 AM Injection made incrementally with aspirations every 5 mL.  Performed by: Personally  Anesthesiologist: Elmer Picker, MD  Additional Notes: Monitors applied. No increased pain on injection. No increased resistance to injection. Injection made in 5cc increments. Good needle visualization. Patient tolerated procedure well.

## 2021-03-31 NOTE — Anesthesia Procedure Notes (Signed)
Anesthesia Regional Block: Adductor canal block   Pre-Anesthetic Checklist: , timeout performed,  Correct Patient, Correct Site, Correct Laterality,  Correct Procedure, Correct Position, site marked,  Risks and benefits discussed,  Surgical consent,  Pre-op evaluation,  At surgeon's request and post-op pain management  Laterality: Left  Prep: Maximum Sterile Barrier Precautions used, chloraprep       Needles:  Injection technique: Single-shot  Needle Type: Echogenic Stimulator Needle     Needle Length: 9cm  Needle Gauge: 22     Additional Needles:   Procedures:,,,, ultrasound used (permanent image in chart),,    Narrative:  Start time: 03/31/2021 11:58 AM End time: 03/31/2021 12:02 PM Injection made incrementally with aspirations every 5 mL.  Performed by: Personally  Anesthesiologist: Elmer Picker, MD  Additional Notes: Monitors applied. No increased pain on injection. No increased resistance to injection. Injection made in 5cc increments. Good needle visualization. Patient tolerated procedure well.
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
## 2021-04-01 ENCOUNTER — Encounter (HOSPITAL_COMMUNITY): Payer: Self-pay | Admitting: Student

## 2021-04-01 NOTE — Anesthesia Postprocedure Evaluation (Signed)
Anesthesia Post Note  Patient: Amond Speranza  Procedure(s) Performed: OPEN REDUCTION INTERNAL FIXATION (ORIF) TIBIA FRACTURE (Left: Leg Lower)     Patient location during evaluation: PACU Anesthesia Type: Regional and General Level of consciousness: awake and alert Pain management: pain level controlled Vital Signs Assessment: post-procedure vital signs reviewed and stable Respiratory status: spontaneous breathing, nonlabored ventilation, respiratory function stable and patient connected to nasal cannula oxygen Cardiovascular status: blood pressure returned to baseline and stable Postop Assessment: no apparent nausea or vomiting Anesthetic complications: no   No notable events documented.  Last Vitals:  Vitals:   03/31/21 1445 03/31/21 1500  BP: (!) 108/59 120/76  Pulse: 76 78  Resp: 12 15  Temp:  36.6 C  SpO2: 99% 100%    Last Pain:  Vitals:   03/31/21 1500  TempSrc:   PainSc: 0-No pain                 Gizell Danser L Drewey Begue

## 2021-04-01 NOTE — TOC CAGE-AID Note (Signed)
Transition of Care [facility]) - CAGE-AID Screening   Patient Details  Name: Bashir Marchetti MRN: [id number] Date of Birth: Jun 27, 1980  Transition of [facility]) CM/SW Contact:    Aarsh Fristoe C Tarpley-Carter, LCSWA Phone Number: 04/01/2021, 10:20 AM   Clinical Narrative: Pt participated in Cage-Aid.  Pt stated he does not use substance, but drinks ETOH socially.  Pt was offered resources.  Pt stated they did not feel that they were in need of resources at this time.  CSW provided pt with resources to possibly use in the future.  Taiz Bickle Tarpley-Carter, MSW, LCSW-A Pronouns:  She/Her/Hers [facility] Clinical Social Worker Direct Number:  [phone] [email or web address]    CAGE-AID Screening:    Have You Ever Felt You Ought to Cut Down on Your Drinking or Drug Use?: No Have People Annoyed You By [employer] Your Drinking Or Drug Use?: No Have You Felt Bad Or Guilty About Your Drinking Or Drug Use?: No Have You Ever Had a Drink or Used Drugs First Thing In The Morning to Steady Your Nerves or to Get Rid of a Hangover?: No CAGE-AID Score: 0  Substance Abuse Education Offered: Yes (Drinks ETOH socially)  Substance abuse interventions: Transport planner

## 2023-06-05 IMAGING — DX DG TIBIA/FIBULA PORT 2V*L*
4 series · 4 of 4 positions shown · non-contrast
Comparison: 03/30/2021

CLINICAL DATA: ORIF distal tibial fracture

EXAM:
PORTABLE LEFT TIBIA AND FIBULA - 2 VIEW

[tibia ap (1 of 2)]
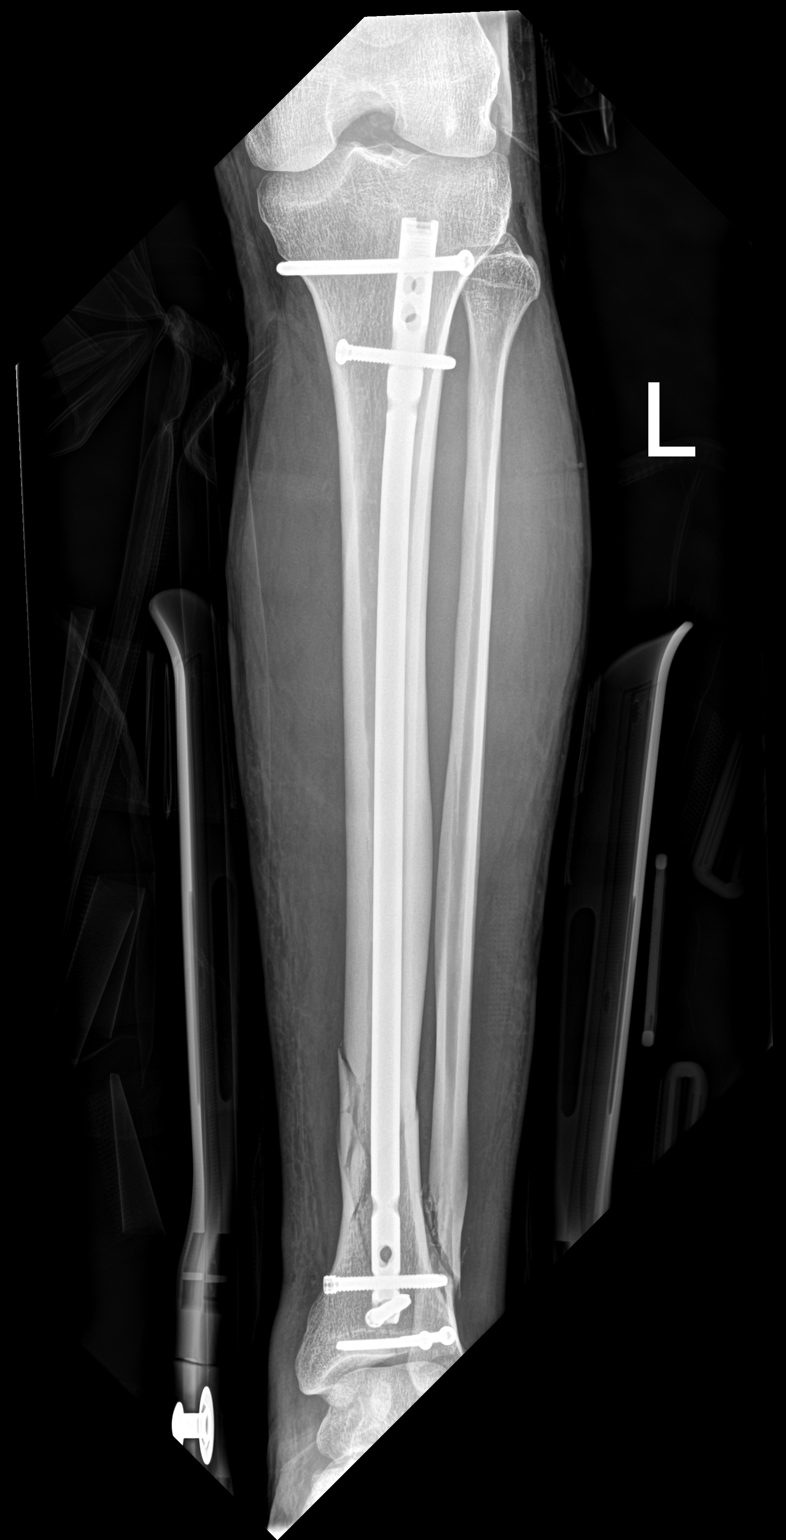

[tibia ap (2 of 2)]
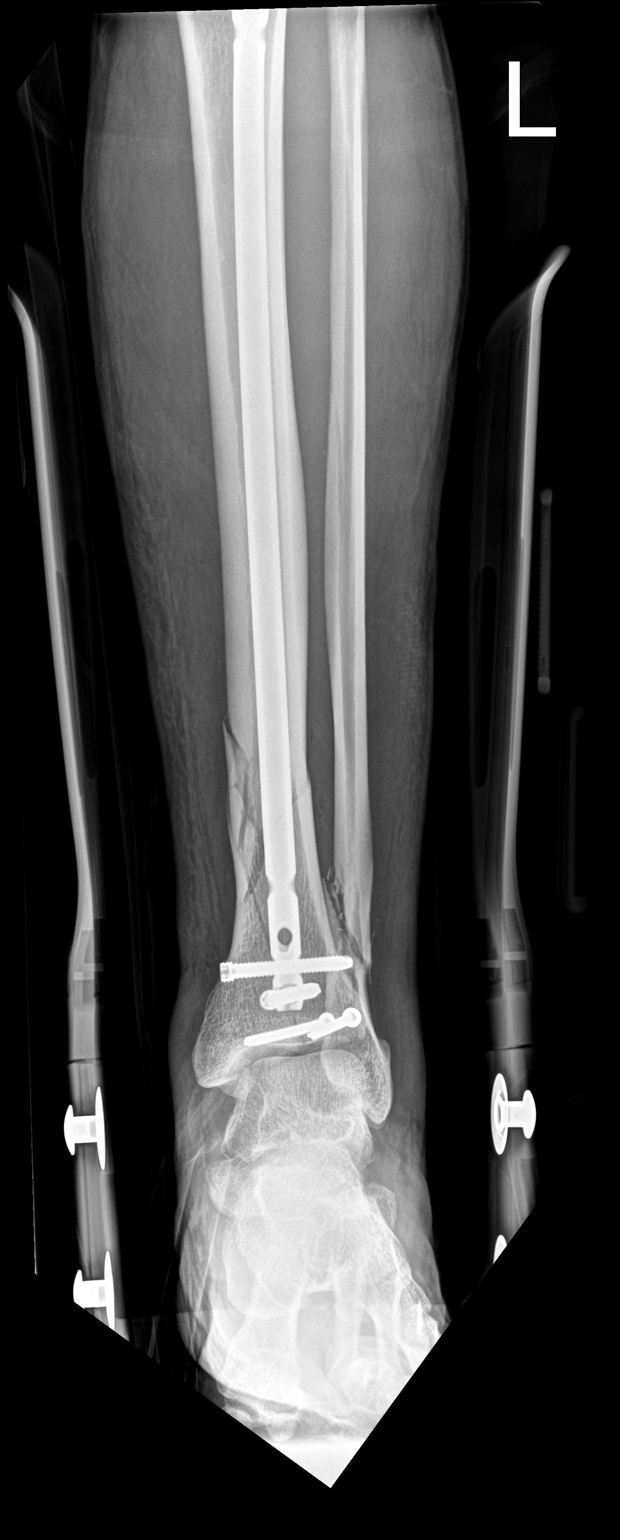

[tibia lat (1 of 2)]
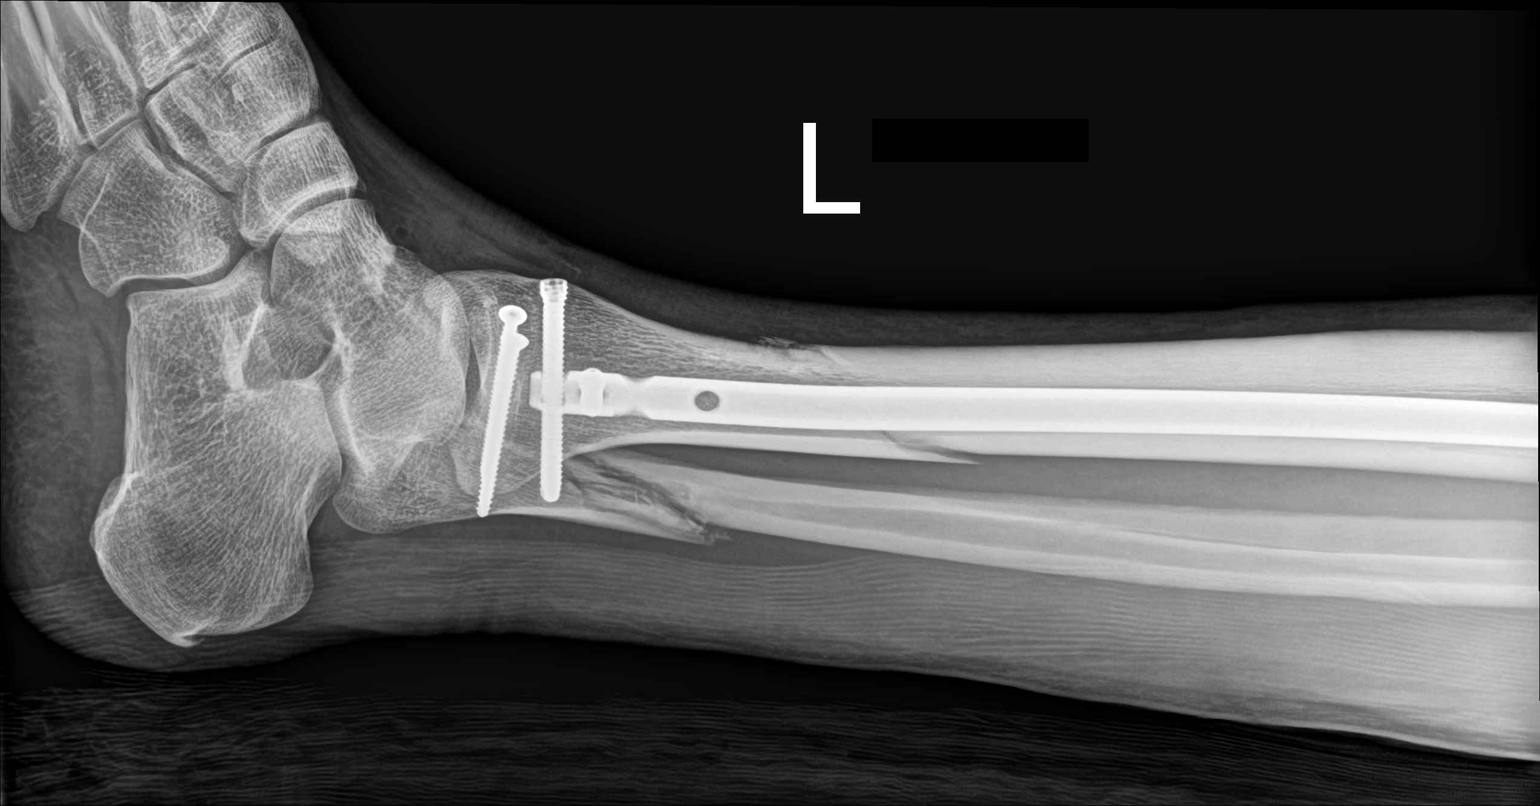

[tibia lat (2 of 2)]
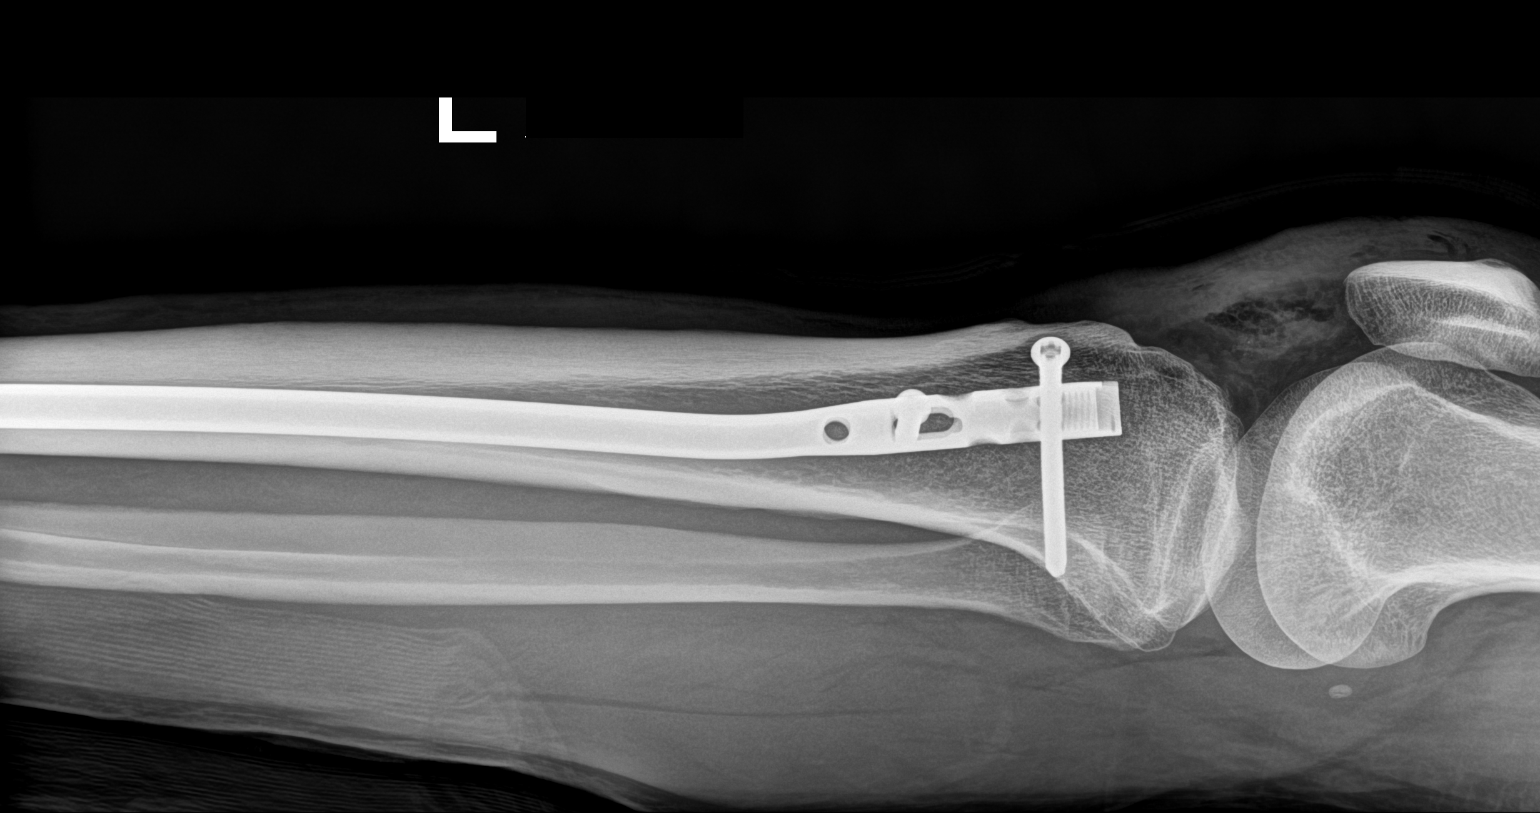

[4 of 4 positions shown; findings below may reference images not displayed]

FINDINGS: Frontal and lateral views of the left tibia and fibula demonstrate
intramedullary rod with proximal and distal interlocking screws
traversing a comminuted distal left tibial fracture. Two additional
screws traverse the tibial plafond. Alignment is near anatomic.

The comminuted distal fibular fracture has been reduced, with near
anatomic alignment. Diffuse soft tissue swelling.
IMPRESSION: 1. ORIF distal left tibial fracture with near anatomic alignment.
2. Reduction of the distal fibular fracture.
